# Patient Record
Sex: Female | Born: 1970
Health system: Southern US, Community
[De-identification: ages and names within clinical notes are randomized; demographics above are authoritative.]

## PROBLEM LIST (undated history)

## (undated) DIAGNOSIS — C4491 Basal cell carcinoma of skin, unspecified: Secondary | ICD-10-CM

## (undated) DIAGNOSIS — F988 Other specified behavioral and emotional disorders with onset usually occurring in childhood and adolescence: Secondary | ICD-10-CM

## (undated) DIAGNOSIS — G479 Sleep disorder, unspecified: Secondary | ICD-10-CM

## (undated) DIAGNOSIS — E559 Vitamin D deficiency, unspecified: Secondary | ICD-10-CM

## (undated) DIAGNOSIS — T7840XA Allergy, unspecified, initial encounter: Secondary | ICD-10-CM

## (undated) DIAGNOSIS — E538 Deficiency of other specified B group vitamins: Secondary | ICD-10-CM

## (undated) HISTORY — PX: BREAST ENHANCEMENT SURGERY: SHX7

## (undated) HISTORY — DX: Vitamin D deficiency, unspecified: E55.9

## (undated) HISTORY — DX: Allergy, unspecified, initial encounter: T78.40XA

## (undated) HISTORY — DX: Basal cell carcinoma of skin, unspecified: C44.91

## (undated) HISTORY — DX: Other specified behavioral and emotional disorders with onset usually occurring in childhood and adolescence: F98.8

## (undated) HISTORY — PX: BASAL CELL CARCINOMA EXCISION: SHX1214

## (undated) HISTORY — DX: Deficiency of other specified B group vitamins: E53.8

## (undated) HISTORY — DX: Sleep disorder, unspecified: G47.9

---
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
## 2002-03-12 HISTORY — PX: DILATION AND CURETTAGE OF UTERUS: SHX78

## 2014-05-22 ENCOUNTER — Emergency Department (HOSPITAL_COMMUNITY)
Admission: EM | Admit: 2014-05-22 | Discharge: 2014-05-22 | Discharge status: Against Medical Advice | Payer: Self-pay | Attending: Emergency Medicine | Admitting: Emergency Medicine

## 2014-05-22 ENCOUNTER — Encounter (HOSPITAL_COMMUNITY): Payer: Self-pay

## 2014-05-22 DIAGNOSIS — S60512A Abrasion of left hand, initial encounter: Secondary | ICD-10-CM | POA: Insufficient documentation

## 2014-05-22 DIAGNOSIS — Y92830 Public park as the place of occurrence of the external cause: Secondary | ICD-10-CM | POA: Insufficient documentation

## 2014-05-22 DIAGNOSIS — Y998 Other external cause status: Secondary | ICD-10-CM | POA: Insufficient documentation

## 2014-05-22 DIAGNOSIS — W01198A Fall on same level from slipping, tripping and stumbling with subsequent striking against other object, initial encounter: Secondary | ICD-10-CM | POA: Insufficient documentation

## 2014-05-22 DIAGNOSIS — Y9389 Activity, other specified: Secondary | ICD-10-CM | POA: Insufficient documentation

## 2014-05-22 DIAGNOSIS — S0181XA Laceration without foreign body of other part of head, initial encounter: Secondary | ICD-10-CM | POA: Insufficient documentation

## 2014-05-22 NOTE — ED Notes (Signed)
Pt at park, tripped over cracked sidewalk, fell hit left forehead, LOC <2 min, Bleeding noted to bottom of chin, abrasion on left hand and on outside of left nare.  A&O x 4.

## 2014-11-25 ENCOUNTER — Encounter: Payer: Self-pay | Admitting: Family Medicine

## 2014-11-25 ENCOUNTER — Ambulatory Visit (INDEPENDENT_AMBULATORY_CARE_PROVIDER_SITE_OTHER): Payer: BLUE CROSS/BLUE SHIELD | Admitting: Family Medicine

## 2014-11-25 VITALS — BP 100/64 | HR 71 | Temp 98.1°F | Resp 16 | Ht 67.0 in | Wt 130.0 lb

## 2014-11-25 DIAGNOSIS — F909 Attention-deficit hyperactivity disorder, unspecified type: Secondary | ICD-10-CM

## 2014-11-25 DIAGNOSIS — G479 Sleep disorder, unspecified: Secondary | ICD-10-CM | POA: Insufficient documentation

## 2014-11-25 DIAGNOSIS — E538 Deficiency of other specified B group vitamins: Secondary | ICD-10-CM | POA: Diagnosis not present

## 2014-11-25 DIAGNOSIS — Z803 Family history of malignant neoplasm of breast: Secondary | ICD-10-CM

## 2014-11-25 DIAGNOSIS — Z23 Encounter for immunization: Secondary | ICD-10-CM

## 2014-11-25 DIAGNOSIS — Z0000 Encounter for general adult medical examination without abnormal findings: Secondary | ICD-10-CM | POA: Diagnosis not present

## 2014-11-25 DIAGNOSIS — F988 Other specified behavioral and emotional disorders with onset usually occurring in childhood and adolescence: Secondary | ICD-10-CM | POA: Insufficient documentation

## 2014-11-25 LAB — COMPREHENSIVE METABOLIC PANEL
ALBUMIN: 4.3 g/dL (ref 3.5–5.2)
ALK PHOS: 64 U/L (ref 39–117)
ALT: 8 U/L (ref 0–35)
AST: 14 U/L (ref 0–37)
BILIRUBIN TOTAL: 0.5 mg/dL (ref 0.2–1.2)
BUN: 12 mg/dL (ref 6–23)
CALCIUM: 9.4 mg/dL (ref 8.4–10.5)
CO2: 29 mEq/L (ref 19–32)
CREATININE: 0.63 mg/dL (ref 0.40–1.20)
Chloride: 103 mEq/L (ref 96–112)
GFR: 108.2 mL/min (ref >60.00)
Glucose, Bld: 81 mg/dL (ref 70–99)
Potassium: 4.2 mEq/L (ref 3.5–5.1)
SODIUM: 138 meq/L (ref 135–145)
TOTAL PROTEIN: 7.1 g/dL (ref 6.0–8.3)

## 2014-11-25 LAB — CBC WITH DIFFERENTIAL/PLATELET
Basophils Absolute: 0.1 10*3/uL (ref 0.0–0.1)
Basophils Relative: 0.9 % (ref 0.0–3.0)
Eosinophils Absolute: 0.3 10*3/uL (ref 0.0–0.7)
Eosinophils Relative: 4.7 % (ref 0.0–5.0)
HCT: 42.4 % (ref 36.0–46.0)
Hemoglobin: 14 g/dL (ref 12.0–15.0)
Lymphocytes Relative: 29.1 % (ref 12.0–46.0)
Lymphs Abs: 1.8 10*3/uL (ref 0.7–4.0)
MCHC: 33 g/dL (ref 30.0–36.0)
MCV: 94.9 fl (ref 78.0–100.0)
Monocytes Absolute: 0.6 10*3/uL (ref 0.1–1.0)
Monocytes Relative: 10 % (ref 3.0–12.0)
Neutro Abs: 3.4 10*3/uL (ref 1.4–7.7)
Neutrophils Relative %: 55.3 % (ref 43.0–77.0)
Platelets: 316 10*3/uL (ref 150.0–400.0)
RBC: 4.47 Mil/uL (ref 3.87–5.11)
RDW: 14.2 % (ref 11.5–15.5)
WBC: 6.2 10*3/uL (ref 4.0–10.5)

## 2014-11-25 LAB — TSH: TSH: 1.53 u[IU]/mL (ref 0.35–4.50)

## 2014-11-25 LAB — LIPID PANEL
Cholesterol: 182 mg/dL (ref 0–200)
HDL: 74.4 mg/dL (ref >39.00)
LDL Cholesterol: 89 mg/dL (ref 0–99)
NonHDL: 107.69
Total CHOL/HDL Ratio: 2
Triglycerides: 94 mg/dL (ref 0.0–149.0)
VLDL: 18.8 mg/dL (ref 0.0–40.0)

## 2014-11-25 LAB — VITAMIN B12: VITAMIN B 12: 540 pg/mL (ref 211–911)

## 2014-11-25 MED ORDER — ATOMOXETINE HCL 25 MG PO CAPS: 25.0000 mg | ORAL_CAPSULE | Freq: Two times a day (BID) | ORAL | Status: DC

## 2014-11-25 NOTE — Progress Notes (Signed)
Pre visit review using our clinic review tool, if applicable. No additional management support is needed unless otherwise documented below in the visit note. 

## 2014-11-25 NOTE — Progress Notes (Signed)
Subjective:    Patient ID: Lindsay Burns, female    DOB: 06-04-1970, 44 y.o.   MRN: [id number]  HPI CC: New patient establishment with multiple complaints  All past medical history, allergies, surgical history, family history and social history was obtained from the patient today and entered into the electronic medical record.  Patient moved from [place], [place] , 2 years ago, and has not obtained a PCP in [place]. Patient has a dental home.  ADD: Patient reports history of ADD, that was diagnosed by psychiatry in 2006 and [place]. She states she has noticed symptoms ever since she was in college, however she never sought treatment. She does not like taking medications, and has not been on medications for some time. She states she was on Strattera 40 mg twice a day and Vyvanse at the same time. 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 However she has contemplated over the last year getting back on at least some of the medication, and feels it is time to restart something to help her focus.  Sleep disorder: Patient states she's had a sleep disorder the majority of her life. Her husband does snore, and this causes her to wake up as well. Patient states the majority of her problem is staying asleep, although she does have trouble falling asleep at times. She has never taken a prescribed medication to help with her sleep disorder. She does admit to taking Benadryl about 2 times a week to help her with her sleep. She feels this is moderately effective. She reports no TV use in the bedroom. She does go into the bedroom and read. She does not go to bed at the same time every night, but she does wake up approximately the same time every day.  B-12 deficiency: Patient states that she has had B-12 deficiency in the past and needed B-12  injections. She has not had her B-12 tested with an quite a few years. She endorses bilateral feet discomfort, that is similar to symptoms she had in the past when she had low B-12.  Health maintenance:  Colonoscopy: No family or personal history of colon cancer, colon polyps or irritable bowel disease Mammogram: Family history maternal aunt of breast cancer. Last mammogram 2 years ago, patient states she's had an MRI of her breast for calcifications in the past, but they were normal. Cervical cancer screening: 2014, all normal Pap smears. Immunizations: Flu vaccination and tetanus overdue Infectious disease screening: Completed in 2014, negative   Review of Systems  Constitutional: Negative for fever, chills, activity change, appetite change, fatigue and unexpected weight change.  HENT: Negative for dental problem, hearing loss, tinnitus, trouble swallowing and voice change.   Eyes: Negative for photophobia, pain and visual disturbance.  Respiratory: Negative for chest tightness, shortness of breath and wheezing.   Cardiovascular: Negative for chest pain, palpitations and leg swelling.  Gastrointestinal: Negative for vomiting, abdominal pain, diarrhea, constipation and blood in stool.  Endocrine: Negative for cold intolerance, heat intolerance, polydipsia and polyuria.  Genitourinary: Negative for difficulty urinating, menstrual problem and pelvic pain.  Musculoskeletal: Positive for myalgias. Negative for arthralgias.  Skin: Negative for rash.  Allergic/Immunologic: Positive for environmental allergies. Negative for food allergies.  Neurological: Negative for dizziness, syncope, weakness, light-headedness and headaches.  Hematological: Negative for adenopathy.  Psychiatric/Behavioral: Positive for sleep disturbance  and decreased concentration. Negative for suicidal ideas, hallucinations, behavioral problems and agitation. The patient is not nervous/anxious and is not hyperactive.         Objective:   Physical Exam BP 100/64 mmHg  Pulse 71  Temp(Src) 98.1 F (36.7 C)  Resp 16  Ht 5' 7"  (1.702 m)  Wt 130 lb (58.968 kg)  BMI 20.36 kg/m2  SpO2 99%  LMP 11/24/2014 (Exact Date) Gen: Afebrile. No acute distress. Nontoxic in appearance, well-developed, well-nourished, physically fit Caucasian female. Pleasant, talkative. HENT: AT. [place]. Bilateral TM visualized and normal in appearance.  MMM. Bilateral nares no erythema or swelling. Throat without erythema or exudates.  Eyes:Pupils Equal Round Reactive to light, Extraocular movements intact, Conjunctiva without redness, discharge or icterus. Neck/lymph/endocrine: Supple, no masses, no lymphadenopathy, no thyromegaly CV: RRR no murmur appreciated, no edema, +2/4 P posterior tibialis pulses Chest: CTAB, no wheeze or crackles Abd: Soft. Flat. NTND. BS present. No Masses palpated Skin: No rashes, purpura or petechiae. Skin is intact Neuro/MSK:  Normal gait. PERLA. EOMi. Alert. Oriented x3. Cranial nerves II through XII intact. Muscle strength 5/5 upper and lower extremity. DTRs equal bilaterally. Psych: Normal affect, dress and demeanor. Normal speech.  Normal judgment. Mild tangential thought process     Assessment & Plan:  1. ADD (attention deficit disorder) - restart Strattera today, tapering up to 50 mg daily. Patient will follow-up in 4 weeks, and we'll discuss that time if we need to continue to taper to a higher dose. - Patient had been taken Strattera twice a day instead of daily in the past, advised patient if she is to do this I wouldn't take it in the evening hours despite not being a stimulant, as it may cause her sleep disorder to worsen. - atomoxetine (STRATTERA) 25 MG capsule; Take 1 capsule (25 mg total) by mouth 2 (two) times daily. 25 mg QD for 3 days, then 25 mg BID  Dispense: 60 capsule; Refill: 0 - Follow-up in 4 weeks  2. Health care maintenance Colonoscopy: No family or personal history of colon cancer,  colon polyps or irritable bowel disease routine screening start at age 50 Mammogram: Family history maternal aunt of breast cancer. Last mammogram 2 years ago, patient states she's had an MRI of her breast for calcifications in the past, but they were normal. Mammogram ordered today, patient does have implants. Cervical cancer screening: 2014, all normal Pap smears. Patient to schedule Pap smear within the next 6 months. Immunizations: Flu vaccination and tetanus overdue. Patient declined flu vaccination today, tdap given. Infectious disease screening: Completed in 2014, negative - Tdap vaccine greater than or equal to 7yo IM - CBC w/Diff - Comp Met (CMET) - Lipid panel - TSH  3. B12 deficiency - Currently taking supplements, with lower extremity symptoms. Will retest B-12 level today, and prescribe injections if needed. - B12  4. Family history of breast cancer - All patients prior mammograms have been normal, with the exception of some calcifications that needed further imaging by MRI and was considered benign. No records are available. - MM DIGITAL SCREENING W/ IMPLANTS BILATERAL; Future   5. Sleep disorder - Discussed her sleep disorder today. Discussed good sleep hygiene. Patient is currently okay with taking Benadryl as needed.  - Continue to follow  Education: AVS on health maintenance recommendations for age/sex provided  Four-week follow-up for ADD, preventive visit within the next 6 months with Pap

## 2014-11-25 NOTE — Patient Instructions (Signed)
Health Maintenance Adopting a healthy lifestyle and getting preventive care can go a long way to promote health and wellness. Talk with your health care provider about what schedule of regular examinations is right for you. This is a good chance for you to check in with your provider about disease prevention and staying healthy. In between checkups, there are plenty of things you can do on your own. Experts have done a lot of research about which lifestyle changes and preventive measures are most likely to keep you healthy. Ask your health care provider for more information. WEIGHT AND DIET  Eat a healthy diet  Be sure to include plenty of vegetables, fruits, low-fat dairy products, and lean protein.  Do not eat a lot of foods high in solid fats, added sugars, or salt.  Get regular exercise. This is one of the most important things you can do for your health.  Most adults should exercise for at least 150 minutes each week. The exercise should increase your heart rate and make you sweat (moderate-intensity exercise).  Most adults should also do strengthening exercises at least twice a week. This is in addition to the moderate-intensity exercise.  Maintain a healthy weight  Body mass index (BMI) is a measurement that can be used to identify possible weight problems. It estimates body fat based on height and weight. Your health care provider can help determine your BMI and help you achieve or maintain a healthy weight.  For females 47 years of age and older:   A BMI below 18.5 is considered underweight.  A BMI of 18.5 to 24.9 is normal.  A BMI of 25 to 29.9 is considered overweight.  A BMI of 30 and above is considered obese.  Watch levels of cholesterol and blood lipids  You should start having your blood tested for lipids and cholesterol at 46 years of age, then have this test every 5 years.  You may need to have your cholesterol levels checked more often if:  Your lipid or  cholesterol levels are high.  You are older than 46 years of age.  You are at high risk for heart disease.  CANCER SCREENING   Lung Cancer  Lung cancer screening is recommended for adults 32-73 years old who are at high risk for lung cancer because of a history of smoking.  A yearly low-dose CT scan of the lungs is recommended for people who:  Currently smoke.  Have quit within the past 15 years.  Have at least a 30-pack-year history of smoking. A pack year is smoking an average of one pack of cigarettes a day for 1 year.  Yearly screening should continue until it has been 15 years since you quit.  Yearly screening should stop if you develop a health problem that would prevent you from having lung cancer treatment.  Breast Cancer  Practice breast self-awareness. This means understanding how your breasts normally appear and feel.  It also means doing regular breast self-exams. Let your health care provider know about any changes, no matter how small.  If you are in your 20s or 30s, you should have a clinical breast exam (CBE) by a health care provider every 1-3 years as part of a regular health exam.  If you are 48 or older, have a CBE every year. Also consider having a breast X-ray (mammogram) every year.  If you have a family history of breast cancer, talk to your health care provider about genetic screening.  If you are  at high risk for breast cancer, talk to your health care provider about having an MRI and a mammogram every year.  Breast cancer gene (BRCA) assessment is recommended for women who have family members with BRCA-related cancers. BRCA-related cancers include:  Breast.  Ovarian.  Tubal.  Peritoneal cancers.  Results of the assessment will determine the need for genetic counseling and BRCA1 and BRCA2 testing. Cervical Cancer Routine pelvic examinations to screen for cervical cancer are no longer recommended for nonpregnant women who are considered low  risk for cancer of the pelvic organs (ovaries, uterus, and vagina) and who do not have symptoms. A pelvic examination may be necessary if you have symptoms including those associated with pelvic infections. Ask your health care provider if a screening pelvic exam is right for you.   The Pap test is the screening test for cervical cancer for women who are considered at risk.  If you had a hysterectomy for a problem that was not cancer or a condition that could lead to cancer, then you no longer need Pap tests.  If you are older than 65 years, and you have had normal Pap tests for the past 10 years, you no longer need to have Pap tests.  If you have had past treatment for cervical cancer or a condition that could lead to cancer, you need Pap tests and screening for cancer for at least 20 years after your treatment.  If you no longer get a Pap test, assess your risk factors if they change (such as having a new sexual partner). This can affect whether you should start being screened again.  Some women have medical problems that increase their chance of getting cervical cancer. If this is the case for you, your health care provider may recommend more frequent screening and Pap tests.  The human papillomavirus (HPV) test is another test that may be used for cervical cancer screening. The HPV test looks for the virus that can cause cell changes in the cervix. The cells collected during the Pap test can be tested for HPV.  The HPV test can be used to screen women 30 years of age and older. Getting tested for HPV can extend the interval between normal Pap tests from three to five years.  An HPV test also should be used to screen women of any age who have unclear Pap test results.  After 46 years of age, women should have HPV testing as often as Pap tests.  Colorectal Cancer  This type of cancer can be detected and often prevented.  Routine colorectal cancer screening usually begins at 46 years of  age and continues through 46 years of age.  Your health care provider may recommend screening at an earlier age if you have risk factors for colon cancer.  Your health care provider may also recommend using home test kits to check for hidden blood in the stool.  A small camera at the end of a tube can be used to examine your colon directly (sigmoidoscopy or colonoscopy). This is done to check for the earliest forms of colorectal cancer.  Routine screening usually begins at age 50.  Direct examination of the colon should be repeated every 5-10 years through 46 years of age. However, you may need to be screened more often if early forms of precancerous polyps or small growths are found. Skin Cancer  Check your skin from head to toe regularly.  Tell your health care provider about any new moles or changes in   moles, especially if there is a change in a mole's shape or color.  Also tell your health care provider if you have a mole that is larger than the size of a pencil eraser.  Always use sunscreen. Apply sunscreen liberally and repeatedly throughout the day.  Protect yourself by wearing long sleeves, pants, a wide-brimmed hat, and sunglasses whenever you are outside. HEART DISEASE, DIABETES, AND HIGH BLOOD PRESSURE   Have your blood pressure checked at least every 1-2 years. High blood pressure causes heart disease and increases the risk of stroke.  If you are between 75 years and 42 years old, ask your health care provider if you should take aspirin to prevent strokes.  Have regular diabetes screenings. This involves taking a blood sample to check your fasting blood sugar level.  If you are at a normal weight and have a low risk for diabetes, have this test once every three years after 46 years of age.  If you are overweight and have a high risk for diabetes, consider being tested at a younger age or more often. PREVENTING INFECTION  Hepatitis B  If you have a higher risk for  hepatitis B, you should be screened for this virus. You are considered at high risk for hepatitis B if:  You were born in a country where hepatitis B is common. Ask your health care provider which countries are considered high risk.  Your parents were born in a high-risk country, and you have not been immunized against hepatitis B (hepatitis B vaccine).  You have HIV or AIDS.  You use needles to inject street drugs.  You live with someone who has hepatitis B.  You have had sex with someone who has hepatitis B.  You get hemodialysis treatment.  You take certain medicines for conditions, including cancer, organ transplantation, and autoimmune conditions. Hepatitis C  Blood testing is recommended for:  Everyone born from [birth year] through 1967.  Anyone with known risk factors for hepatitis C. Sexually transmitted infections (STIs)  You should be screened for sexually transmitted infections (STIs) including gonorrhea and chlamydia if:  You are sexually active and are younger than 46 years of age.  You are older than 46 years of age and your health care provider tells you that you are at risk for this type of infection.  Your sexual activity has changed since you were last screened and you are at an increased risk for chlamydia or gonorrhea. Ask your health care provider if you are at risk.  If you do not have HIV, but are at risk, it may be recommended that you take a prescription medicine daily to prevent HIV infection. This is called pre-exposure prophylaxis (PrEP). You are considered at risk if:  You are sexually active and do not regularly use condoms or know the HIV status of your partner(s).  You take drugs by injection.  You are sexually active with a partner who has HIV. Talk with your health care provider about whether you are at high risk of being infected with HIV. If you choose to begin PrEP, you should first be tested for HIV. You should then be tested every 3 months for  as long as you are taking PrEP.  PREGNANCY   If you are premenopausal and you may become pregnant, ask your health care provider about preconception counseling.  If you may become pregnant, take 400 to 800 micrograms (mcg) of folic acid every day.  If you want to prevent pregnancy, talk to your  health care provider about birth control (contraception). OSTEOPOROSIS AND MENOPAUSE   Osteoporosis is a disease in which the bones lose minerals and strength with aging. This can result in serious bone fractures. Your risk for osteoporosis can be identified using a bone density scan.  If you are 19 years of age or older, or if you are at risk for osteoporosis and fractures, ask your health care provider if you should be screened.  Ask your health care provider whether you should take a calcium or vitamin D supplement to lower your risk for osteoporosis.  Menopause may have certain physical symptoms and risks.  Hormone replacement therapy may reduce some of these symptoms and risks. Talk to your health care provider about whether hormone replacement therapy is right for you.  HOME CARE INSTRUCTIONS   Schedule regular health, dental, and eye exams.  Stay current with your immunizations.   Do not use any tobacco products including cigarettes, chewing tobacco, or electronic cigarettes.  If you are pregnant, do not drink alcohol.  If you are breastfeeding, limit how much and how often you drink alcohol.  Limit alcohol intake to no more than 1 drink per day for nonpregnant women. One drink equals 12 ounces of beer, 5 ounces of wine, or 1 ounces of hard liquor.  Do not use street drugs.  Do not share needles.  Ask your health care provider for help if you need support or information about quitting drugs.  Tell your health care provider if you often feel depressed.  Tell your health care provider if you have ever been abused or do not feel safe at home. Document Released: 09/11/2010  Document Revised: 07/13/2013 Document Reviewed: 01/28/2013 [facility] Patient Information 2015 [place], [place]. This information is not intended to replace advice given to you by your health care provider. Make sure you discuss any questions you have with your health care provider.   Insomnia Insomnia is frequent trouble falling and/or staying asleep. Insomnia can be a long term problem or a short term problem. Both are common. Insomnia can be a short term problem when the wakefulness is related to a certain stress or worry. Long term insomnia is often related to ongoing stress during waking hours and/or poor sleeping habits. Overtime, sleep deprivation itself can make the problem worse. Every little thing feels more severe because you are overtired and your ability to cope is decreased. CAUSES   Stress, anxiety, and depression.  Poor sleeping habits.  Distractions such as TV in the bedroom.  Naps close to bedtime.  Engaging in emotionally charged conversations before bed.  Technical reading before sleep.  Alcohol and other sedatives. They may make the problem worse. They can hurt normal sleep patterns and normal dream activity.  Stimulants such as caffeine for several hours prior to bedtime.  Pain syndromes and shortness of breath can cause insomnia.  Exercise late at night.  Changing time zones may cause sleeping problems (jet lag). It is sometimes helpful to have someone observe your sleeping patterns. They should look for periods of not breathing during the night (sleep apnea). They should also look to see how long those periods last. If you live alone or observers are uncertain, you can also be observed at a sleep clinic where your sleep patterns will be professionally monitored. Sleep apnea requires a checkup and treatment. Give your caregivers your medical history. Give your caregivers observations your family has made about your sleep.  SYMPTOMS   Not feeling rested in the  morning.  Anxiety and restlessness at bedtime.  Difficulty falling and staying asleep. TREATMENT   Your caregiver may prescribe treatment for an underlying medical disorders. Your caregiver can give advice or help if you are using alcohol or other drugs for self-medication. Treatment of underlying problems will usually eliminate insomnia problems.  Medications can be prescribed for short time use. They are generally not recommended for lengthy use.  Over-the-counter sleep medicines are not recommended for lengthy use. They can be habit forming.  You can promote easier sleeping by making lifestyle changes such as:  Using relaxation techniques that help with breathing and reduce muscle tension.  Exercising earlier in the day.  Changing your diet and the time of your last meal. No night time snacks.  Establish a regular time to go to bed.  Counseling can help with stressful problems and worry.  Soothing music and white noise may be helpful if there are background noises you cannot remove.  Stop tedious detailed work at least one hour before bedtime. HOME CARE INSTRUCTIONS   Keep a diary. Inform your caregiver about your progress. This includes any medication side effects. See your caregiver regularly. Take note of:  Times when you are asleep.  Times when you are awake during the night.  The quality of your sleep.  How you feel the next day. This information will help your caregiver care for you.  Get out of bed if you are still awake after 15 minutes. Read or do some quiet activity. Keep the lights down. Wait until you feel sleepy and go back to bed.  Keep regular sleeping and waking hours. Avoid naps.  Exercise regularly.  Avoid distractions at bedtime. Distractions include watching television or engaging in any intense or detailed activity like attempting to balance the household checkbook.  Develop a bedtime ritual. Keep a familiar routine of bathing, brushing your  teeth, climbing into bed at the same time each night, listening to soothing music. Routines increase the success of falling to sleep faster.  Use relaxation techniques. This can be using breathing and muscle tension release routines. It can also include visualizing peaceful scenes. You can also help control troubling or intruding thoughts by keeping your mind occupied with boring or repetitive thoughts like the old concept of counting sheep. You can make it more creative like imagining planting one beautiful flower after another in your backyard garden.  During your day, work to eliminate stress. When this is not possible use some of the previous suggestions to help reduce the anxiety that accompanies stressful situations. MAKE SURE YOU:   Understand these instructions.  Will watch your condition.  Will get help right away if you are not doing well or get worse. Document Released: 02/24/2000 Document Revised: 05/21/2011 Document Reviewed: 03/26/2007 [facility] Patient Information 2015 [place], [place]. This information is not intended to replace advice given to you by your health care provider. Make sure you discuss any questions you have with your health care provider.   4 weeks for ADD follow up

## 2014-11-26 ENCOUNTER — Telehealth: Payer: Self-pay | Admitting: Family Medicine

## 2014-11-26 NOTE — Telephone Encounter (Signed)
Patient aware of results. No questions at this time.

## 2014-11-26 NOTE — Telephone Encounter (Signed)
Please call pt, all of her lab work was normal, including her B12 level.

## 2014-12-23 ENCOUNTER — Ambulatory Visit (INDEPENDENT_AMBULATORY_CARE_PROVIDER_SITE_OTHER): Payer: BLUE CROSS/BLUE SHIELD | Admitting: Family Medicine

## 2014-12-23 ENCOUNTER — Other Ambulatory Visit: Payer: Self-pay | Admitting: Family Medicine

## 2014-12-23 ENCOUNTER — Encounter: Payer: Self-pay | Admitting: Family Medicine

## 2014-12-23 VITALS — BP 96/66 | HR 71 | Temp 98.2°F | Resp 18 | Ht 67.0 in | Wt 130.0 lb

## 2014-12-23 DIAGNOSIS — F988 Other specified behavioral and emotional disorders with onset usually occurring in childhood and adolescence: Secondary | ICD-10-CM

## 2014-12-23 DIAGNOSIS — F909 Attention-deficit hyperactivity disorder, unspecified type: Secondary | ICD-10-CM | POA: Diagnosis not present

## 2014-12-23 DIAGNOSIS — Z803 Family history of malignant neoplasm of breast: Secondary | ICD-10-CM

## 2014-12-23 DIAGNOSIS — Z1231 Encounter for screening mammogram for malignant neoplasm of breast: Secondary | ICD-10-CM

## 2014-12-23 MED ORDER — ATOMOXETINE HCL 25 MG PO CAPS: 25.0000 mg | ORAL_CAPSULE | Freq: Two times a day (BID) | ORAL | Status: DC

## 2014-12-23 NOTE — Progress Notes (Signed)
Pre visit review using our clinic review tool, if applicable. No additional management support is needed unless otherwise documented below in the visit note. 

## 2014-12-23 NOTE — Progress Notes (Signed)
Subjective:    Patient ID: Lindsay Burns, female    DOB: 04-11-1970, 44 y.o.   MRN: [id number]  HPI  ADD: Patient presents to follow-up on her attention deficit disorder. Patient states she has started the Strattera 25 mg. However she does admit to not taking it 2 times a day as we discussed. She states she routinely forgets to take it in the afternoon. She and her husband are setting up the system so that she can more easily remember to take her medication as directed. She states she does feel a small positive effect after taking the medication. She denies any negative side effects. She reports she is eating and sleeping a little better.  Prior note:  ADD: Patient reports history of ADD, that was diagnosed by psychiatry in 2006 and [place]. She states she has noticed symptoms ever since she was in college, however she never sought treatment. She does not like taking medications, and has not been on medications for some time. She states she was on Strattera 40 mg twice a day and Vyvanse at the same time. 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 However she has contemplated over the last year getting back on at least some of the medication, and feels it is time to restart something to help her focus.  Past Medical History  Diagnosis Date  . ADD (attention deficit disorder)     dx: 2006  . Basal cell carcinoma     left side of face  . Sleep disorder   . B12 deficiency    No Known Allergies Social History   Social History  . Marital Status: Married    Spouse Name: N/A  . Number of Children: 2  . Years of Education: J.D.   Occupational History  . 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    Social History Main Topics  . Smoking status: Never Smoker   . Smokeless tobacco: Not on file  . Alcohol Use:  0.6 oz/week    1 Standard drinks or equivalent per week     Comment: most fridays will have 1 drink   . Drug Use: No  . Sexual Activity:    Partners: Male    Birth Control/ Protection: None   Other Topics Concern  . Not on file   Social History Narrative   G3P2. Patient is married. Her husband works at [employer]. 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 She lives with her husband, son and daughter.   - She feels safe in her relationship, she wears her seatbelt, she wears a bike helmet, there are smoke alarms in her home. There are no firearms in her home.   - She drinks alcohol occasionally, she drinks caffeinated beverages, she does not take vitamins or herbal remedies.   - She exercises regularly.    Review of Systems Negative, with the exception of above mentioned in HPI     Objective:   Physical Exam BP 96/66 mmHg  Pulse 71  Temp(Src) 98.2 F (36.8 C) (Temporal)  Resp 18  Ht 5\' 7"  (1.702 m)  Wt 130 lb (58.968 kg)  BMI 20.36 kg/m2  SpO2 98%  LMP 12/22/2014 Gen: Afebrile. No acute distress. Nontoxic in appearance, well-developed, well-nourished, very pleasant  Caucasian female. HENT: AT. [place].MMM. Eyes:Pupils Equal Round Reactive to light, Extraocular movements intact,  Conjunctiva without redness, discharge or icterus. CV: RRR  Chest: CTAB, no wheeze or crackles Neuro: Normal gait. PERLA. EOMi. Alert. Oriented x3  Psych: Normal affect, dress and demeanor. Normal speech. Normal thought content and judgment..    Assessment & Plan:  1. ADD (attention deficit disorder) - Refills of Strattera given today for 3 months. Patient encouraged to set up the system, set an alarm on her phone place pill bottle where she can see it that she can remember to take the medication twice a day. Patient is to report any negative symptoms of experienced after routinely taking medication as directed.  follow-up in 3 months. - atomoxetine (STRATTERA) 25 MG  capsule; Take 1 capsule (25 mg total) by mouth 2 (two) times daily. 25 mg QD for 3 days, then 25 mg BID  Dispense: 60 capsule; Refill: 0

## 2014-12-23 NOTE — Patient Instructions (Signed)
Insomnia Insomnia is a sleep disorder that makes it difficult to fall asleep or to stay asleep. Insomnia can cause tiredness (fatigue), low energy, difficulty concentrating, mood swings, and poor performance at work or school.  There are three different ways to classify insomnia:  Difficulty falling asleep.  Difficulty staying asleep.  Waking up too early in the morning. Any type of insomnia can be long-term (chronic) or short-term (acute). Both are common. Short-term insomnia usually lasts for three months or less. Chronic insomnia occurs at least three times a week for longer than three months. CAUSES  Insomnia may be caused by another condition, situation, or substance, such as:  Anxiety.  Certain medicines.  Gastroesophageal reflux disease (GERD) or other gastrointestinal conditions.  Asthma or other breathing conditions.  Restless legs syndrome, sleep apnea, or other sleep disorders.  Chronic pain.  Menopause. This may include hot flashes.  Stroke.  Abuse of alcohol, tobacco, or illegal drugs.  Depression.  Caffeine.   Neurological disorders, such as Alzheimer disease.  An overactive thyroid (hyperthyroidism). The cause of insomnia may not be known. RISK FACTORS Risk factors for insomnia include:  Gender. Women are more commonly affected than men.  Age. Insomnia is more common as you get older.  Stress. This may involve your professional or personal life.  Income. Insomnia is more common in people with lower income.  Lack of exercise.   Irregular work schedule or night shifts.  Traveling between different time zones. SIGNS AND SYMPTOMS If you have insomnia, trouble falling asleep or trouble staying asleep is the main symptom. This may lead to other symptoms, such as:  Feeling fatigued.  Feeling nervous about going to sleep.  Not feeling rested in the morning.  Having trouble concentrating.  Feeling irritable, anxious, or depressed. TREATMENT   Treatment for insomnia depends on the cause. If your insomnia is caused by an underlying condition, treatment will focus on addressing the condition. Treatment may also include:   Medicines to help you sleep.  Counseling or therapy.  Lifestyle adjustments. HOME CARE INSTRUCTIONS   Take medicines only as directed by your health care provider.  Keep regular sleeping and waking hours. Avoid naps.  Keep a sleep diary to help you and your health care provider figure out what could be causing your insomnia. Include:   When you sleep.  When you wake up during the night.  How well you sleep.   How rested you feel the next day.  Any side effects of medicines you are taking.  What you eat and drink.   Make your bedroom a comfortable place where it is easy to fall asleep:  Put up shades or special blackout curtains to block light from outside.  Use a white noise machine to block noise.  Keep the temperature cool.   Exercise regularly as directed by your health care provider. Avoid exercising right before bedtime.  Use relaxation techniques to manage stress. Ask your health care provider to suggest some techniques that may work well for you. These may include:  Breathing exercises.  Routines to release muscle tension.  Visualizing peaceful scenes.  Cut back on alcohol, caffeinated beverages, and cigarettes, especially close to bedtime. These can disrupt your sleep.  Do not overeat or eat spicy foods right before bedtime. This can lead to digestive discomfort that can make it hard for you to sleep.  Limit screen use before bedtime. This includes:  Watching TV.  Using your smartphone, tablet, and computer.  Stick to a routine. This   can help you fall asleep faster. Try to do a quiet activity, brush your teeth, and go to bed at the same time each night.  Get out of bed if you are still awake after 15 minutes of trying to sleep. Keep the lights down, but try reading or  doing a quiet activity. When you feel sleepy, go back to bed.  Make sure that you drive carefully. Avoid driving if you feel very sleepy.  Keep all follow-up appointments as directed by your health care provider. This is important. SEEK MEDICAL CARE IF:   You are tired throughout the day or have trouble in your daily routine due to sleepiness.  You continue to have sleep problems or your sleep problems get worse. SEEK IMMEDIATE MEDICAL CARE IF:   You have serious thoughts about hurting yourself or someone else.   This information is not intended to replace advice given to you by your health care provider. Make sure you discuss any questions you have with your health care provider.   Document Released: 02/24/2000 Document Revised: 11/17/2014 Document Reviewed: 11/27/2013 Elsevier Interactive Patient Education 2016 [place].  F/U 3 months Preventive/CPE with PAP March.

## 2015-01-13 ENCOUNTER — Ambulatory Visit
Admission: RE | Admit: 2015-01-13 | Discharge: 2015-01-13 | Disposition: A | Discharge status: Home / Self Care | Payer: BLUE CROSS/BLUE SHIELD | Source: Ambulatory Visit | Attending: Family Medicine | Admitting: Family Medicine

## 2015-01-13 DIAGNOSIS — Z803 Family history of malignant neoplasm of breast: Secondary | ICD-10-CM

## 2015-01-13 DIAGNOSIS — R928 Other abnormal and inconclusive findings on diagnostic imaging of breast: Secondary | ICD-10-CM

## 2015-01-13 DIAGNOSIS — Z1231 Encounter for screening mammogram for malignant neoplasm of breast: Secondary | ICD-10-CM

## 2015-02-02 DIAGNOSIS — R928 Other abnormal and inconclusive findings on diagnostic imaging of breast: Principal | ICD-10-CM

## 2015-02-02 DIAGNOSIS — Z1239 Encounter for other screening for malignant neoplasm of breast: Secondary | ICD-10-CM | POA: Insufficient documentation

## 2015-02-08 ENCOUNTER — Other Ambulatory Visit: Payer: Self-pay | Admitting: Family Medicine

## 2015-02-08 DIAGNOSIS — R928 Other abnormal and inconclusive findings on diagnostic imaging of breast: Secondary | ICD-10-CM

## 2015-02-09 ENCOUNTER — Ambulatory Visit
Admission: RE | Admit: 2015-02-09 | Discharge: 2015-02-09 | Disposition: A | Discharge status: Home / Self Care | Payer: BLUE CROSS/BLUE SHIELD | Source: Ambulatory Visit | Attending: Family Medicine | Admitting: Family Medicine

## 2015-02-09 DIAGNOSIS — R928 Other abnormal and inconclusive findings on diagnostic imaging of breast: Secondary | ICD-10-CM

## 2015-04-19 ENCOUNTER — Other Ambulatory Visit: Payer: Self-pay | Admitting: Family Medicine

## 2015-04-19 ENCOUNTER — Telehealth: Payer: Self-pay | Admitting: *Deleted

## 2015-04-19 DIAGNOSIS — F988 Other specified behavioral and emotional disorders with onset usually occurring in childhood and adolescence: Secondary | ICD-10-CM

## 2015-04-19 MED ORDER — ATOMOXETINE HCL 25 MG PO CAPS: 25.0000 mg | ORAL_CAPSULE | Freq: Two times a day (BID) | ORAL | Status: DC

## 2015-04-19 NOTE — Telephone Encounter (Signed)
Received request for refill on strattera.last filled in 02/19/15 patient last seen on 12/23/14.

## 2015-04-19 NOTE — Telephone Encounter (Signed)
Refill x1 on Strattera, pt will need appt for refills. She is due for her CPE in March, these can be covered at the same time as long as her medication regimen for ADD is stable.

## 2015-04-19 NOTE — Telephone Encounter (Signed)
Left message for patient Rx ready for pick up . Patient needs to schedule CPE for March for further refills.

## 2015-08-09 ENCOUNTER — Other Ambulatory Visit (HOSPITAL_COMMUNITY)
Admission: RE | Admit: 2015-08-09 | Discharge: 2015-08-09 | Disposition: A | Discharge status: Home / Self Care | Payer: BLUE CROSS/BLUE SHIELD | Source: Ambulatory Visit | Attending: Family Medicine | Admitting: Family Medicine

## 2015-08-09 ENCOUNTER — Ambulatory Visit (INDEPENDENT_AMBULATORY_CARE_PROVIDER_SITE_OTHER): Payer: BLUE CROSS/BLUE SHIELD | Admitting: Family Medicine

## 2015-08-09 ENCOUNTER — Encounter: Payer: Self-pay | Admitting: Family Medicine

## 2015-08-09 VITALS — BP 97/63 | HR 73 | Temp 97.8°F | Resp 20 | Ht 67.0 in | Wt 132.0 lb

## 2015-08-09 DIAGNOSIS — Z01419 Encounter for gynecological examination (general) (routine) without abnormal findings: Secondary | ICD-10-CM | POA: Insufficient documentation

## 2015-08-09 DIAGNOSIS — Z124 Encounter for screening for malignant neoplasm of cervix: Secondary | ICD-10-CM

## 2015-08-09 DIAGNOSIS — Z79899 Other long term (current) drug therapy: Secondary | ICD-10-CM

## 2015-08-09 DIAGNOSIS — Z13220 Encounter for screening for lipoid disorders: Secondary | ICD-10-CM | POA: Diagnosis not present

## 2015-08-09 DIAGNOSIS — Z1151 Encounter for screening for human papillomavirus (HPV): Secondary | ICD-10-CM | POA: Insufficient documentation

## 2015-08-09 DIAGNOSIS — F909 Attention-deficit hyperactivity disorder, unspecified type: Secondary | ICD-10-CM

## 2015-08-09 DIAGNOSIS — F988 Other specified behavioral and emotional disorders with onset usually occurring in childhood and adolescence: Secondary | ICD-10-CM

## 2015-08-09 DIAGNOSIS — Z130 Encounter for screening for diseases of the blood and blood-forming organs and certain disorders involving the immune mechanism: Secondary | ICD-10-CM

## 2015-08-09 DIAGNOSIS — Z0000 Encounter for general adult medical examination without abnormal findings: Secondary | ICD-10-CM | POA: Insufficient documentation

## 2015-08-09 DIAGNOSIS — Z1329 Encounter for screening for other suspected endocrine disorder: Secondary | ICD-10-CM

## 2015-08-09 MED ORDER — ATOMOXETINE HCL 25 MG PO CAPS: 25.0000 mg | ORAL_CAPSULE | Freq: Two times a day (BID) | ORAL | Status: DC

## 2015-08-09 NOTE — Progress Notes (Signed)
Patient ID: Lindsay Burns, female   DOB: 05-01-1970, 45 y.o.   MRN: [id number]      Patient ID: Lindsay Burns, female  DOB: 1971/01/04, 45 y.o.   MRN: [id number]  Subjective:  Lindsay Burns is a 45 y.o. female present for CPE with PAP All past medical history, surgical history, allergies, family history, immunizations, medications and social history were updated in the electronic medical record today. All recent labs, ED visits and hospitalizations within the last year were reviewed.  ADD:  Patient states she does better with the Strattera 25 mg BID. However she does admit to not taking it 2 times a day as we discussed routinely because she forgets. She states she does feel a positive effect after taking the medication. She denies any negative side effects. She is in need of refills today.  Health maintenance:  Colonoscopy: No family or personal history of colon cancer, colon polyps or irritable bowel disease. Screen at 50.  Mammogram: 01/2015 mammogram w/implants, needed further studies of right breast for concerns of mass, however further studies proved negative. Birads 1 , yearly screen. Family history maternal aunt of breast cancer. Cervical cancer screening: 2014, all normal Pap smears. Immunizations: Flu vaccination and tetanus UTD 2016 Infectious disease screening: Completed in 2014, negative DEXA: Not indicated Assistive device: None  Oxygen [id number]  Patient has a Dental home. Hospitalizations/ED visits: None   Past Medical History  Diagnosis Date  . ADD (attention deficit disorder)     dx: 2006  . Basal cell carcinoma     left side of face  . Sleep disorder   . B12 deficiency    No Known Allergies Past Surgical History  Procedure Laterality Date  . Temporomandibular joint surgery Bilateral 1987  . Dilation and curettage of uterus  2004  . Breast enhancement surgery Bilateral     1999  . Basal cell carcinoma excision Left     left face   Family  History  Problem Relation Age of Onset  . Alcohol abuse Mother     was in [place]  . Breast cancer Mother 68  . Depression Mother   . Alcohol abuse Father     was in [place]  . Lung cancer Father   . Brain cancer Father   . Breast cancer Paternal Grandmother    Social History   Social History  . Marital Status: Married    Spouse Name: N/A  . Number of Children: 2  . Years of Education: J.D.   Occupational History  . 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    Social History Main Topics  . Smoking status: Never Smoker   . Smokeless tobacco: Not on file  . Alcohol Use: 0.6 oz/week    1 Standard drinks or equivalent per week     Comment: most fridays will have 1 drink   . Drug Use: No  . Sexual Activity:    Partners: Male    Birth Control/ Protection: None   Other Topics Concern  . Not on file   Social History Narrative   G3P2. Patient is married. Her husband works at [employer]. 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 She lives with her husband, son and daughter.   - She feels safe in her relationship, she wears her seatbelt, she wears a bike helmet, there are smoke alarms in her home. There are no firearms in her home.   -  She drinks alcohol occasionally, she drinks caffeinated beverages, she does not take vitamins or herbal remedies.   - She exercises regularly.     ROS: Negative, with the exception of above mentioned in HPI  Objective: BP 97/63 mmHg  Pulse 73  Temp(Src) 97.8 F (36.6 C) (Oral)  Resp 20  Ht 5\' 7"  (1.702 m)  Wt 132 lb (59.875 kg)  BMI 20.67 kg/m2  SpO2 98%  LMP 07/26/2015 (Approximate) Gen: Afebrile. No acute distress. Nontoxic in appearance, well-developed, well-nourished, caucasian female, very pleasant.  HENT: AT. [place]. Bilateral TM visualized and normal in appearance, normal external auditory canal. MMM, no oral lesions, Good dentition. Bilateral nares without erythema or swelling. Throat  without erythema, ulcerations or exudates. No  Cough on exam, no hoarseness on exam. Eyes:Pupils Equal Round Reactive to light, Extraocular movements intact,  Conjunctiva without redness, discharge or icterus. Neck/lymp/endocrine: Supple,No  lymphadenopathy, No thyromegaly CV: RRR, nomurmur, No edema, +2/4 P posterior tibialis pulses.  Chest: CTAB, no wheeze, rhonchi or crackles. Normal Respiratory effort. Good Air movement. Abd: Soft. flat. NTND. BS present. No Masses palpated. No hepatosplenomegaly. No rebound tenderness or guarding. Skin: No rashes, purpura or petechiae. Warm and well-perfused. Skin intact. Neuro/Msk: Normal gait. PERLA. EOMi. Alert. Oriented x3.  Cranial nerves II through XII intact. Muscle strength 5/5 UE/LE extremity. DTRs equal bilaterally. Psych: Normal affect, dress and demeanor. Normal speech. Normal thought content and judgment. Breasts:Implants present. breasts appear normal, symmetrical, no tenderness on exam, no suspicious masses, no skin or nipple changes or axillary nodes. GYN:  External genitalia within normal limits, normal hair distribution, no lesions. Urethral meatus normal, no lesions. Vaginal mucosa pink, moist, normal rugae, no lesions. No cystocele or rectocele. cervix without lesions, moderate thin white discharge,  Bimanual exam revealed normal uterus.  No bladder/suprapubic fullness, masses or tenderness. No cervical motion tenderness. No adnexal fullness. Anus and perineum within normal limits, no lesions.  Assessment/plan: Lindsay Burns is a 45 y.o. female present for annual exam. 1. ADD (attention deficit disorder) - atomoxetine (STRATTERA) 25 MG capsule; Take 1 capsule (25 mg total) by mouth 2 (two) times daily.  Dispense: 180 capsule; Refill: 1 - Comprehensive metabolic panel; Future   2. Pap smear for cervical cancer screening - Cytology - PAP   3. Screening cholesterol level - Lipid panel; Future  4. Screening, anemia, deficiency,  iron - CBC w/Diff; Future  5. Thyroid disorder screen - TSH; Future  6. Encounter for long-term (current) use of medications - Comprehensive metabolic panel; Future  7. Encounter for preventive health examination - Cbc, cmp, lipid panel, tsh - PAP today with HPV co-test Colonoscopy: No family or personal history of colon cancer, colon polyps or irritable bowel disease. Screen at 50.  Mammogram: 01/2015 mammogram w/implants, needed further studies of right breast for concerns of mass, however further studies proved negative. Birads 1 , yearly screen. Family history maternal aunt of breast cancer. Cervical cancer screening: 2014, all normal Pap smears. Immunizations: Flu vaccination and tetanus UTD 2016 Infectious disease screening: Completed in 2014, negative DEXA: Not indicated -Patient was encouraged to exercise greater than 150 minutes a week. Patient was encouraged to choose a diet filled with fresh fruits and vegetables, and lean meats. AVS provided to patient today for education/recommendation on gender specific health and safety maintenance.  Return in about 6 months (around 02/09/2016) for add.  Electronically signed by: Howard Pouch, DO [place]

## 2015-08-09 NOTE — Patient Instructions (Signed)
Health Maintenance, Female Adopting a healthy lifestyle and getting preventive care can go a long way to promote health and wellness. Talk with your health care provider about what schedule of regular examinations is right for you. This is a good chance for you to check in with your provider about disease prevention and staying healthy. In between checkups, there are plenty of things you can do on your own. Experts have done a lot of research about which lifestyle changes and preventive measures are most likely to keep you healthy. Ask your health care provider for more information. WEIGHT AND DIET  Eat a healthy diet  Be sure to include plenty of vegetables, fruits, low-fat dairy products, and lean protein.  Do not eat a lot of foods high in solid fats, added sugars, or salt.  Get regular exercise. This is one of the most important things you can do for your health.  Most adults should exercise for at least 150 minutes each week. The exercise should increase your heart rate and make you sweat (moderate-intensity exercise).  Most adults should also do strengthening exercises at least twice a week. This is in addition to the moderate-intensity exercise.  Maintain a healthy weight  Body mass index (BMI) is a measurement that can be used to identify possible weight problems. It estimates body fat based on height and weight. Your health care provider can help determine your BMI and help you achieve or maintain a healthy weight.  For females 20 years of age and older:   A BMI below 18.5 is considered underweight.  A BMI of 18.5 to 24.9 is normal.  A BMI of 25 to 29.9 is considered overweight.  A BMI of 30 and above is considered obese.  Watch levels of cholesterol and blood lipids  You should start having your blood tested for lipids and cholesterol at 47 years of age, then have this test every 5 years.  You may need to have your cholesterol levels checked more often if:  Your lipid  or cholesterol levels are high.  You are older than 47 years of age.  You are at high risk for heart disease.  CANCER SCREENING   Lung Cancer  Lung cancer screening is recommended for adults 55-80 years old who are at high risk for lung cancer because of a history of smoking.  A yearly low-dose CT scan of the lungs is recommended for people who:  Currently smoke.  Have quit within the past 15 years.  Have at least a 30-pack-year history of smoking. A pack year is smoking an average of one pack of cigarettes a day for 1 year.  Yearly screening should continue until it has been 15 years since you quit.  Yearly screening should stop if you develop a health problem that would prevent you from having lung cancer treatment.  Breast Cancer  Practice breast self-awareness. This means understanding how your breasts normally appear and feel.  It also means doing regular breast self-exams. Let your health care provider know about any changes, no matter how small.  If you are in your 20s or 30s, you should have a clinical breast exam (CBE) by a health care provider every 1-3 years as part of a regular health exam.  If you are 40 or older, have a CBE every year. Also consider having a breast X-ray (mammogram) every year.  If you have a family history of breast cancer, talk to your health care provider about genetic screening.  If you   are at high risk for breast cancer, talk to your health care provider about having an MRI and a mammogram every year.  Breast cancer gene (BRCA) assessment is recommended for women who have family members with BRCA-related cancers. BRCA-related cancers include:  Breast.  Ovarian.  Tubal.  Peritoneal cancers.  Results of the assessment will determine the need for genetic counseling and BRCA1 and BRCA2 testing. Cervical Cancer Your health care provider may recommend that you be screened regularly for cancer of the pelvic organs (ovaries, uterus, and  vagina). This screening involves a pelvic examination, including checking for microscopic changes to the surface of your cervix (Pap test). You may be encouraged to have this screening done every 3 years, beginning at age 21.  For women ages 30-65, health care providers may recommend pelvic exams and Pap testing every 3 years, or they may recommend the Pap and pelvic exam, combined with testing for human papilloma virus (HPV), every 5 years. Some types of HPV increase your risk of cervical cancer. Testing for HPV may also be done on women of any age with unclear Pap test results.  Other health care providers may not recommend any screening for nonpregnant women who are considered low risk for pelvic cancer and who do not have symptoms. Ask your health care provider if a screening pelvic exam is right for you.  If you have had past treatment for cervical cancer or a condition that could lead to cancer, you need Pap tests and screening for cancer for at least 20 years after your treatment. If Pap tests have been discontinued, your risk factors (such as having a new sexual partner) need to be reassessed to determine if screening should resume. Some women have medical problems that increase the chance of getting cervical cancer. In these cases, your health care provider may recommend more frequent screening and Pap tests. Colorectal Cancer  This type of cancer can be detected and often prevented.  Routine colorectal cancer screening usually begins at 47 years of age and continues through 47 years of age.  Your health care provider may recommend screening at an earlier age if you have risk factors for colon cancer.  Your health care provider may also recommend using home test kits to check for hidden blood in the stool.  A small camera at the end of a tube can be used to examine your colon directly (sigmoidoscopy or colonoscopy). This is done to check for the earliest forms of colorectal  cancer.  Routine screening usually begins at age 50.  Direct examination of the colon should be repeated every 5-10 years through 47 years of age. However, you may need to be screened more often if early forms of precancerous polyps or small growths are found. Skin Cancer  Check your skin from head to toe regularly.  Tell your health care provider about any new moles or changes in moles, especially if there is a change in a mole's shape or color.  Also tell your health care provider if you have a mole that is larger than the size of a pencil eraser.  Always use sunscreen. Apply sunscreen liberally and repeatedly throughout the day.  Protect yourself by wearing long sleeves, pants, a wide-brimmed hat, and sunglasses whenever you are outside. HEART DISEASE, DIABETES, AND HIGH BLOOD PRESSURE   High blood pressure causes heart disease and increases the risk of stroke. High blood pressure is more likely to develop in:  People who have blood pressure in the high end   of the normal range (130-139/85-89 mm Hg).  People who are overweight or obese.  People who are African American.  If you are 38-23 years of age, have your blood pressure checked every 3-5 years. If you are 61 years of age or older, have your blood pressure checked every year. You should have your blood pressure measured twice--once when you are at a hospital or clinic, and once when you are not at a hospital or clinic. Record the average of the two measurements. To check your blood pressure when you are not at a hospital or clinic, you can use:  An automated blood pressure machine at a pharmacy.  A home blood pressure monitor.  If you are between 45 years and 39 years old, ask your health care provider if you should take aspirin to prevent strokes.  Have regular diabetes screenings. This involves taking a blood sample to check your fasting blood sugar level.  If you are at a normal weight and have a low risk for diabetes,  have this test once every three years after 47 years of age.  If you are overweight and have a high risk for diabetes, consider being tested at a younger age or more often. PREVENTING INFECTION  Hepatitis B  If you have a higher risk for hepatitis B, you should be screened for this virus. You are considered at high risk for hepatitis B if:  You were born in a country where hepatitis B is common. Ask your health care provider which countries are considered high risk.  Your parents were born in a high-risk country, and you have not been immunized against hepatitis B (hepatitis B vaccine).  You have HIV or AIDS.  You use needles to inject street drugs.  You live with someone who has hepatitis B.  You have had sex with someone who has hepatitis B.  You get hemodialysis treatment.  You take certain medicines for conditions, including cancer, organ transplantation, and autoimmune conditions. Hepatitis C  Blood testing is recommended for:  Everyone born from [birth year] through 1967.  Anyone with known risk factors for hepatitis C. Sexually transmitted infections (STIs)  You should be screened for sexually transmitted infections (STIs) including gonorrhea and chlamydia if:  You are sexually active and are younger than 47 years of age.  You are older than 47 years of age and your health care provider tells you that you are at risk for this type of infection.  Your sexual activity has changed since you were last screened and you are at an increased risk for chlamydia or gonorrhea. Ask your health care provider if you are at risk.  If you do not have HIV, but are at risk, it may be recommended that you take a prescription medicine daily to prevent HIV infection. This is called pre-exposure prophylaxis (PrEP). You are considered at risk if:  You are sexually active and do not regularly use condoms or know the HIV status of your partner(s).  You take drugs by injection.  You are sexually  active with a partner who has HIV. Talk with your health care provider about whether you are at high risk of being infected with HIV. If you choose to begin PrEP, you should first be tested for HIV. You should then be tested every 3 months for as long as you are taking PrEP.  PREGNANCY   If you are premenopausal and you may become pregnant, ask your health care provider about preconception counseling.  If you may  become pregnant, take 400 to 800 micrograms (mcg) of folic acid every day.  If you want to prevent pregnancy, talk to your health care provider about birth control (contraception). OSTEOPOROSIS AND MENOPAUSE   Osteoporosis is a disease in which the bones lose minerals and strength with aging. This can result in serious bone fractures. Your risk for osteoporosis can be identified using a bone density scan.  If you are 38 years of age or older, or if you are at risk for osteoporosis and fractures, ask your health care provider if you should be screened.  Ask your health care provider whether you should take a calcium or vitamin D supplement to lower your risk for osteoporosis.  Menopause may have certain physical symptoms and risks.  Hormone replacement therapy may reduce some of these symptoms and risks. Talk to your health care provider about whether hormone replacement therapy is right for you.  HOME CARE INSTRUCTIONS   Schedule regular health, dental, and eye exams.  Stay current with your immunizations.   Do not use any tobacco products including cigarettes, chewing tobacco, or electronic cigarettes.  If you are pregnant, do not drink alcohol.  If you are breastfeeding, limit how much and how often you drink alcohol.  Limit alcohol intake to no more than 1 drink per day for nonpregnant women. One drink equals 12 ounces of beer, 5 ounces of wine, or 1 ounces of hard liquor.  Do not use street drugs.  Do not share needles.  Ask your health care provider for help if  you need support or information about quitting drugs.  Tell your health care provider if you often feel depressed.  Tell your health care provider if you have ever been abused or do not feel safe at home.   This information is not intended to replace advice given to you by your health care provider. Make sure you discuss any questions you have with your health care provider.   Document Released: 09/11/2010 Document Revised: 03/19/2014 Document Reviewed: 01/28/2013 Elsevier Interactive Patient Education [employer].

## 2015-08-10 ENCOUNTER — Telehealth: Payer: Self-pay | Admitting: Family Medicine

## 2015-08-10 LAB — CYTOLOGY - PAP

## 2015-08-10 NOTE — Telephone Encounter (Signed)
Please call pt: - PAP is negative, with negative HPV-->next pap 3-5 years. - still awaiting labs to be collected.

## 2015-08-11 NOTE — Telephone Encounter (Signed)
Spoke with patient reviewed PAP results.

## 2015-08-17 ENCOUNTER — Telehealth: Payer: Self-pay | Admitting: Family Medicine

## 2015-08-17 ENCOUNTER — Other Ambulatory Visit (INDEPENDENT_AMBULATORY_CARE_PROVIDER_SITE_OTHER): Payer: BLUE CROSS/BLUE SHIELD

## 2015-08-17 DIAGNOSIS — F909 Attention-deficit hyperactivity disorder, unspecified type: Secondary | ICD-10-CM

## 2015-08-17 DIAGNOSIS — Z130 Encounter for screening for diseases of the blood and blood-forming organs and certain disorders involving the immune mechanism: Secondary | ICD-10-CM

## 2015-08-17 DIAGNOSIS — Z13220 Encounter for screening for lipoid disorders: Secondary | ICD-10-CM | POA: Diagnosis not present

## 2015-08-17 DIAGNOSIS — Z124 Encounter for screening for malignant neoplasm of cervix: Secondary | ICD-10-CM | POA: Diagnosis not present

## 2015-08-17 DIAGNOSIS — Z79899 Other long term (current) drug therapy: Secondary | ICD-10-CM

## 2015-08-17 DIAGNOSIS — Z1329 Encounter for screening for other suspected endocrine disorder: Secondary | ICD-10-CM

## 2015-08-17 DIAGNOSIS — F988 Other specified behavioral and emotional disorders with onset usually occurring in childhood and adolescence: Secondary | ICD-10-CM

## 2015-08-17 LAB — CBC WITH DIFFERENTIAL/PLATELET
BASOS PCT: 0.3 % (ref 0.0–3.0)
Basophils Absolute: 0 10*3/uL (ref 0.0–0.1)
EOS ABS: 0.2 10*3/uL (ref 0.0–0.7)
Eosinophils Relative: 3.6 % (ref 0.0–5.0)
HEMATOCRIT: 38.7 % (ref 36.0–46.0)
Hemoglobin: 12.9 g/dL (ref 12.0–15.0)
LYMPHS ABS: 1.9 10*3/uL (ref 0.7–4.0)
LYMPHS PCT: 34.4 % (ref 12.0–46.0)
MCHC: 33.3 g/dL (ref 30.0–36.0)
MCV: 91.3 fl (ref 78.0–100.0)
MONO ABS: 0.7 10*3/uL (ref 0.1–1.0)
Monocytes Relative: 11.9 % (ref 3.0–12.0)
NEUTROS ABS: 2.8 10*3/uL (ref 1.4–7.7)
Neutrophils Relative %: 49.8 % (ref 43.0–77.0)
PLATELETS: 289 10*3/uL (ref 150.0–400.0)
RBC: 4.24 Mil/uL (ref 3.87–5.11)
RDW: 15.1 % (ref 11.5–15.5)
WBC: 5.6 10*3/uL (ref 4.0–10.5)

## 2015-08-17 LAB — TSH: TSH: 2.37 u[IU]/mL (ref 0.35–4.50)

## 2015-08-17 LAB — COMPREHENSIVE METABOLIC PANEL
ALBUMIN: 4.1 g/dL (ref 3.5–5.2)
ALK PHOS: 55 U/L (ref 39–117)
ALT: 11 U/L (ref 0–35)
AST: 16 U/L (ref 0–37)
BILIRUBIN TOTAL: 0.6 mg/dL (ref 0.2–1.2)
BUN: 14 mg/dL (ref 6–23)
CO2: 27 mEq/L (ref 19–32)
Calcium: 9.1 mg/dL (ref 8.4–10.5)
Chloride: 103 mEq/L (ref 96–112)
Creatinine, Ser: 0.62 mg/dL (ref 0.40–1.20)
GFR: 109.86 mL/min (ref >60.00)
GLUCOSE: 89 mg/dL (ref 70–99)
POTASSIUM: 4.8 meq/L (ref 3.5–5.1)
SODIUM: 137 meq/L (ref 135–145)
TOTAL PROTEIN: 6.7 g/dL (ref 6.0–8.3)

## 2015-08-17 LAB — LIPID PANEL
CHOL/HDL RATIO: 2
Cholesterol: 180 mg/dL (ref 0–200)
HDL: 79.7 mg/dL (ref >39.00)
LDL Cholesterol: 82 mg/dL (ref 0–99)
NONHDL: 100.44
Triglycerides: 90 mg/dL (ref 0.0–149.0)
VLDL: 18 mg/dL (ref 0.0–40.0)

## 2015-08-17 NOTE — Telephone Encounter (Signed)
Please call pt: - her labs are all normal. They look great. Results for orders placed or performed in visit on 08/17/15 (from the past 72 hour(s))  CBC w/Diff     Status: None   Collection Time: 08/17/15  8:01 AM  Result Value Ref Range   WBC 5.6 4.0 - 10.5 K/uL   RBC 4.24 3.87 - 5.11 Mil/uL   Hemoglobin 12.9 12.0 - 15.0 g/dL   HCT 38.7 36.0 - 46.0 %   MCV 91.3 78.0 - 100.0 fl   MCHC 33.3 30.0 - 36.0 g/dL   RDW 15.1 11.5 - 15.5 %   Platelets 289.0 150.0 - 400.0 K/uL   Neutrophils Relative % 49.8 43.0 - 77.0 %   Lymphocytes Relative 34.4 12.0 - 46.0 %   Monocytes Relative 11.9 3.0 - 12.0 %   Eosinophils Relative 3.6 0.0 - 5.0 %   Basophils Relative 0.3 0.0 - 3.0 %   Neutro Abs 2.8 1.4 - 7.7 K/uL   Lymphs Abs 1.9 0.7 - 4.0 K/uL   Monocytes Absolute 0.7 0.1 - 1.0 K/uL   Eosinophils Absolute 0.2 0.0 - 0.7 K/uL   Basophils Absolute 0.0 0.0 - 0.1 K/uL  Comprehensive metabolic panel     Status: None   Collection Time: 08/17/15  8:01 AM  Result Value Ref Range   Sodium 137 135 - 145 mEq/L   Potassium 4.8 3.5 - 5.1 mEq/L   Chloride 103 96 - 112 mEq/L   CO2 27 19 - 32 mEq/L   Glucose, Bld 89 70 - 99 mg/dL   BUN 14 6 - 23 mg/dL   Creatinine, Ser 0.62 0.40 - 1.20 mg/dL   Total Bilirubin 0.6 0.2 - 1.2 mg/dL   Alkaline Phosphatase 55 39 - 117 U/L   AST 16 0 - 37 U/L   ALT 11 0 - 35 U/L   Total Protein 6.7 6.0 - 8.3 g/dL   Albumin 4.1 3.5 - 5.2 g/dL   Calcium 9.1 8.4 - 10.5 mg/dL   GFR 109.86 >60.00 mL/min  TSH     Status: None   Collection Time: 08/17/15  8:01 AM  Result Value Ref Range   TSH 2.37 0.35 - 4.50 uIU/mL  Lipid panel     Status: None   Collection Time: 08/17/15  8:01 AM  Result Value Ref Range   Cholesterol 180 0 - 200 mg/dL    Comment: ATP III Classification       Desirable:  < 200 mg/dL               Borderline High:  200 - 239 mg/dL          High:  > = 240 mg/dL   Triglycerides 90.0 0.0 - 149.0 mg/dL    Comment: Normal:  <150 mg/dLBorderline High:  150 - 199 mg/dL    HDL 79.70 >39.00 mg/dL   VLDL 18.0 0.0 - 40.0 mg/dL   LDL Cholesterol 82 0 - 99 mg/dL   Total CHOL/HDL Ratio 2     Comment:                Men          Women1/2 Average Risk     3.4          3.3Average Risk          5.0          4.42X Average Risk          9.6  7.13X Average Risk          15.0          11.0                       NonHDL 100.44     Comment: NOTE:  Non-HDL goal should be 30 mg/dL higher than patient's LDL goal (i.e. LDL goal of < 70 mg/dL, would have non-HDL goal of < 100 mg/dL)

## 2015-08-18 NOTE — Telephone Encounter (Signed)
Left message with patient lab results on voice mail.

## 2016-04-27 ENCOUNTER — Telehealth: Payer: Self-pay | Admitting: Family Medicine

## 2016-04-27 NOTE — Telephone Encounter (Signed)
Patients insurance for RX has changed. For atomoxetine (STRATTERA) 25 MG capsule [id number] , [facility]... [facility] bin# [id number] ID# [id number]

## 2016-04-27 NOTE — Telephone Encounter (Signed)
Received request for refills on Strattera 25 mg capsules, pt has not been seen in >9 months, she would need to be seen to continue to refill this medication for her.

## 2016-04-27 NOTE — Telephone Encounter (Signed)
Dr. Kuneff pt.  

## 2016-04-27 NOTE — Telephone Encounter (Signed)
Tried to contact patient voice mail is full. Patient needs an appt for refills.

## 2016-04-30 NOTE — Telephone Encounter (Signed)
Left message for patient she needs office visit for refills on strattera.

## 2016-05-09 ENCOUNTER — Ambulatory Visit (INDEPENDENT_AMBULATORY_CARE_PROVIDER_SITE_OTHER): Payer: BLUE CROSS/BLUE SHIELD | Admitting: Family Medicine

## 2016-05-09 ENCOUNTER — Encounter: Payer: Self-pay | Admitting: Family Medicine

## 2016-05-09 DIAGNOSIS — F988 Other specified behavioral and emotional disorders with onset usually occurring in childhood and adolescence: Secondary | ICD-10-CM

## 2016-05-09 MED ORDER — ATOMOXETINE HCL 25 MG PO CAPS: 25.0000 mg | ORAL_CAPSULE | Freq: Two times a day (BID) | ORAL | 1 refills | Status: DC

## 2016-05-09 NOTE — Patient Instructions (Signed)
Follow up every 6 months on ADD.   Yearly on CPE.

## 2016-05-09 NOTE — Progress Notes (Signed)
Lindsay Burns , [birth year], 46 y.o., female MRN: [id number] Patient Care Team    Relationship Specialty Notifications Start End  Ma Hillock, DO PCP - General Family Medicine  11/25/14     CC: ADD Subjective:  ADD (attention deficit disorder) She has been doing well on Strattera. She has continued to forget afternoon doses at times. She denies any unintentional weight loss. She has been working out 5x a week. She denies any side effects. She does feel she has the ability to focus better on the medications.   No Known Allergies Social History  Substance Use Topics  . Smoking status: Never Smoker  . Smokeless tobacco: Never Used  . Alcohol use 0.6 oz/week    1 Standard drinks or equivalent per week     Comment: most fridays will have 1 drink    Past Medical History:  Diagnosis Date  . ADD (attention deficit disorder)    dx: 2006  . B12 deficiency   . Basal cell carcinoma    left side of face  . Sleep disorder    Past Surgical History:  Procedure Laterality Date  . BASAL CELL CARCINOMA EXCISION Left    left face  . BREAST ENHANCEMENT SURGERY Bilateral    1999  . DILATION AND CURETTAGE OF UTERUS  2004  . TEMPOROMANDIBULAR JOINT SURGERY Bilateral 1987   Family History  Problem Relation Age of Onset  . Alcohol abuse Mother     was in [place]  . Breast cancer Mother 67  . Depression Mother   . Alcohol abuse Father     was in [place]  . Lung cancer Father   . Brain cancer Father   . Breast cancer Paternal Grandmother    Allergies as of 05/09/2016   No Known Allergies     Medication List       Accurate as of 05/09/16  1:58 PM. Always use your most recent med list.          atomoxetine 25 MG capsule Commonly known as:  STRATTERA Take 1 capsule (25 mg total) by mouth 2 (two) times daily.   loratadine 10 MG tablet Commonly known as:  CLARITIN Take 10 mg by mouth daily.       No results found for this or any previous visit (from the past 24 hour(s)). No  results found.   ROS: Negative, with the exception of above mentioned in HPI   Objective:  BP 100/69 (BP Location: Right Arm, Patient Position: Sitting, Cuff Size: Normal)   Pulse 84   Temp 98.2 F (36.8 C)   Resp 20   Ht 5\' 7"  (1.702 m)   Wt 128 lb 4 oz (58.2 kg)   SpO2 98%   BMI 20.09 kg/m  Body mass index is 20.09 kg/m. Gen: Afebrile. No acute distress. Nontoxic in appearance, well developed, well nourished.  HENT: AT. [place].MMM Eyes:Pupils Equal Round Reactive to light, Extraocular movements intact,  Conjunctiva without redness, discharge or icterus. CV: RRR  Chest: CTAB, no wheeze or crackles. Good air movement, normal resp effort.  Abd: Soft. NTND. BS present Neuro: Normal gait. PERLA. EOMi. Alert. Oriented x3  Psych: Normal affect, dress and demeanor. Normal speech. Normal thought content and judgment.  Assessment/Plan: Lindsay Burns is a 46 y.o. female present for acute OV for  Attention deficit disorder (ADD) without hyperactivity - refills provided today for 6 mos. Set phone alarm to remember afternoon dose. - she has lost 4 lbs, however she is working  out 5 x a week now.   - atomoxetine (STRATTERA) 25 MG capsule; Take 1 capsule (25 mg total) by mouth 2 (two) times daily.  Dispense: 180 capsule; Refill: 1 - F/U 6 mos  electronically signed by:  Howard Pouch, DO  [place]

## 2016-07-11 ENCOUNTER — Telehealth: Payer: Self-pay | Admitting: *Deleted

## 2016-07-11 NOTE — Telephone Encounter (Signed)
Patient husband called and states his insurance changed and medications need [facility]. Added pharmacy to patient list. Advised husband [facility] can [facility]

## 2016-11-14 ENCOUNTER — Ambulatory Visit (INDEPENDENT_AMBULATORY_CARE_PROVIDER_SITE_OTHER): Payer: BLUE CROSS/BLUE SHIELD | Admitting: Family Medicine

## 2016-11-14 ENCOUNTER — Encounter: Payer: Self-pay | Admitting: Family Medicine

## 2016-11-14 VITALS — BP 98/62 | HR 86 | Temp 98.2°F | Wt 132.4 lb

## 2016-11-14 DIAGNOSIS — B354 Tinea corporis: Secondary | ICD-10-CM

## 2016-11-14 DIAGNOSIS — B351 Tinea unguium: Secondary | ICD-10-CM

## 2016-11-14 LAB — COMPREHENSIVE METABOLIC PANEL
ALT: 8 U/L (ref 0–35)
AST: 15 U/L (ref 0–37)
Albumin: 4.2 g/dL (ref 3.5–5.2)
Alkaline Phosphatase: 50 U/L (ref 39–117)
BILIRUBIN TOTAL: 0.3 mg/dL (ref 0.2–1.2)
BUN: 13 mg/dL (ref 6–23)
CO2: 27 meq/L (ref 19–32)
Calcium: 9.3 mg/dL (ref 8.4–10.5)
Chloride: 102 mEq/L (ref 96–112)
Creatinine, Ser: 0.64 mg/dL (ref 0.40–1.20)
GFR: 105.35 mL/min (ref >60.00)
Glucose, Bld: 95 mg/dL (ref 70–99)
POTASSIUM: 3.7 meq/L (ref 3.5–5.1)
Sodium: 137 mEq/L (ref 135–145)
Total Protein: 6.9 g/dL (ref 6.0–8.3)

## 2016-11-14 LAB — CBC
HCT: 36.4 % (ref 36.0–46.0)
HEMOGLOBIN: 12 g/dL (ref 12.0–15.0)
MCHC: 32.8 g/dL (ref 30.0–36.0)
MCV: 92.5 fl (ref 78.0–100.0)
Platelets: 295 10*3/uL (ref 150.0–400.0)
RBC: 3.94 Mil/uL (ref 3.87–5.11)
RDW: 14.7 % (ref 11.5–15.5)
WBC: 5 10*3/uL (ref 4.0–10.5)

## 2016-11-14 MED ORDER — TERBINAFINE HCL 250 MG PO TABS: 250.0000 mg | ORAL_TABLET | Freq: Every day | ORAL | 0 refills | Status: DC

## 2016-11-14 NOTE — Progress Notes (Signed)
   Subjective:    Patient ID: Lindsay Burns, female    DOB: 1970/09/16, 46 y.o.   MRN: [id number]  HPI This is a 46 yo female who presents today with ringworm of left forearm. 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 Her family has been affected also (children on oral and topical treatment). Rash x 2-3 weeks.  Her skin is generally very sensitive and gets red and irritated easily.  Has tried ketaconazole (caused burning, redness), clotrimazole (irritated skin), some improvement with topical lamisil 1-2 times a day for last 3 days.  She has recurrent toenail fungus on her left great toe. Took a course of terbinifine about 5 years ago with clearing for about 3 years. She would like to treat tinea corpis and onychomycosis with terbinafine.  Past Medical History:  Diagnosis Date  . ADD (attention deficit disorder)    dx: 2006  . B12 deficiency   . Basal cell carcinoma    left side of face  . Sleep disorder    Past Surgical History:  Procedure Laterality Date  . BASAL CELL CARCINOMA EXCISION Left    left face  . BREAST ENHANCEMENT SURGERY Bilateral    1999  . DILATION AND CURETTAGE OF UTERUS  2004  . TEMPOROMANDIBULAR JOINT SURGERY Bilateral 1987    Review of Systems Per HPI    Objective:   Physical Exam  Constitutional: She is oriented to person, place, and time. She appears well-developed and well-nourished.  HENT:  Head: Normocephalic and atraumatic.  Cardiovascular: Normal rate.   Pulmonary/Chest: Effort normal.  Neurological: She is alert and oriented to person, place, and time.  Skin: Skin is warm and dry.     Left great toenail thickened, discolored.   Psychiatric: She has a normal mood and affect. Her behavior is normal. Judgment and thought content normal.  Vitals reviewed.     BP 98/62 (BP Location: Right Arm, Patient Position: Sitting, Cuff Size: Normal)   Pulse 86   Temp 98.2 F (36.8 C) (Oral)   Wt 132 lb 6.4 oz  (60.1 kg)   LMP 11/11/2016   SpO2 97%   BMI 20.74 kg/m  Wt Readings from Last 3 Encounters:  11/14/16 132 lb 6.4 oz (60.1 kg)  05/09/16 128 lb 4 oz (58.2 kg)  08/09/15 132 lb (59.9 kg)       Assessment & Plan:  1. Tinea corporis - continue topical treatment and add terbinafine for both conditions, will check labs since none for greater than 1 year - CBC - Comprehensive metabolic panel - terbinafine (LAMISIL) 250 MG tablet; Take 1 tablet (250 mg total) by mouth daily.  Dispense: 84 tablet; Refill: 0 - Provided written and verbal information regarding diagnosis and treatment. - RTC precautions reviewed  2. Onychomycosis of toenail - CBC - Comprehensive metabolic panel - terbinafine (LAMISIL) 250 MG tablet; Take 1 tablet (250 mg total) by mouth daily.  Dispense: 84 tablet; Refill: 0   Clarene Reamer, FNP-BC  [facility]  11/14/2016 3:34 PM

## 2016-11-14 NOTE — Patient Instructions (Addendum)
Please call and schedule an appointment for screening mammogram. A referral is not needed.  [phone]   If rash not improved in 5-7 days, please follow up.   Continue lamisil twice a day for up to 14 days.    Body Ringworm Body ringworm is an infection of the skin that often causes a ring-shaped rash. Body ringworm can affect any part of your skin. It can spread easily to others. Body ringworm is also called tinea corporis. What are the causes? This condition is caused by funguses called dermatophytes. The condition develops when these funguses grow out of control on the skin. You can get this condition if you touch a person or animal that has it. You can also get it if you share clothing, bedding, towels, or any other object with an infected person or pet. What increases the risk? This condition is more likely to develop in:  Athletes who often make skin-to-skin contact with other athletes, such as wrestlers.  People who share equipment and mats.  People with a weakened immune system.  What are the signs or symptoms? Symptoms of this condition include:  Itchy, raised red spots and bumps.  Red scaly patches.  A ring-shaped rash. The rash may have: ? A clear center. ? Scales or red bumps at its center. ? Redness near its borders. ? Dry and scaly skin on or around it.  How is this diagnosed? This condition can usually be diagnosed with a skin exam. A skin scraping may be taken from the affected area and examined under a microscope to see if the fungus is present. How is this treated? This condition may be treated with:  An antifungal cream or ointment.  An antifungal shampoo.  Antifungal medicines. These may be prescribed if your ringworm is severe, keeps coming back, or lasts a long time.  Follow these instructions at home:  Take over-the-counter and prescription medicines only as told by your health care provider.  If you were given an antifungal cream or  ointment: ? Use it as told by your health care provider. ? Wash the infected area and dry it completely before applying the cream or ointment.  If you were given an antifungal shampoo: ? Use it as told by your health care provider. ? Leave the shampoo on your body for 3-5 minutes before rinsing.  While you have a rash: ? Wear loose clothing to stop clothes from rubbing and irritating it. ? Wash or change your bed sheets every night.  If your pet has the same infection, take your pet to see a Animal nutritionist. How is this prevented?  Practice good hygiene.  Wear sandals or shoes in public places and showers.  Do not share personal items with others.  Avoid touching red patches of skin on other people.  Avoid touching pets that have bald spots.  If you touch an animal that has a bald spot, wash your hands. Contact a health care provider if:  Your rash continues to spread after 7 days of treatment.  Your rash is not gone in 4 weeks.  The area around your rash gets red, warm, tender, and swollen. This information is not intended to replace advice given to you by your health care provider. Make sure you discuss any questions you have with your health care provider. Document Released: 02/24/2000 Document Revised: 08/04/2015 Document Reviewed: 12/23/2014 Elsevier Interactive Patient Education  2018 [place].  Fungal Nail Infection Fungal nail infection is a common fungal infection of the  toenails or fingernails. This condition affects toenails more often than fingernails. More than one nail may be infected. The condition can be passed from person to person (is contagious). What are the causes? This condition is caused by a fungus. Several types of funguses can cause the infection. These funguses are common in moist and warm areas. If your hands or feet come into contact with the fungus, it may get into a crack in your fingernail or toenail and cause the infection. What increases  the risk? The following factors may make you more likely to develop this condition:  Being female.  Having diabetes.  Being of older age.  Living with someone who has the fungus.  Walking barefoot in areas where the fungus thrives, such as showers or locker rooms.  Having poor circulation.  Wearing shoes and socks that cause your feet to sweat.  Having athlete's foot.  Having a nail injury or history of a recent nail surgery.  Having psoriasis.  Having a weak body defense system (immune system).  What are the signs or symptoms? Symptoms of this condition include:  A pale spot on the nail.  Thickening of the nail.  A nail that becomes yellow or brown.  A brittle or ragged nail edge.  A crumbling nail.  A nail that has lifted away from the nail bed.  How is this diagnosed? This condition is diagnosed with a physical exam. Your health care provider may take a scraping or clipping from your nail to test for the fungus. How is this treated? Mild infections do not need treatment. If you have significant nail changes, treatment may include:  Oral antifungal medicines. You may need to take the medicine for several weeks or several months, and you may not see the results for a long time. These medicines can cause side effects. Ask your health care provider what problems to watch for.  Antifungal nail polish and nail cream. These may be used along with oral antifungal medicines.  Laser treatment of the nail.  Surgery to remove the nail. This may be needed for the most severe infections.  Treatment takes a long time, and the infection may come back. Follow these instructions at home: Medicines  Take or apply over-the-counter and prescription medicines only as told by your health care provider.  Ask your health care provider about using over-the-counter mentholated ointment on your nails. Lifestyle   Do not share personal items, such as towels or nail  clippers.  Trim your nails often.  Wash and dry your hands and feet every day.  Wear absorbent socks, and change your socks frequently.  Wear shoes that allow air to circulate, such as sandals or canvas tennis shoes. Throw out old shoes.  Wear rubber gloves if you are working with your hands in wet areas.  Do not walk barefoot in shower rooms or locker rooms.  Do not use a nail salon that does not use clean instruments.  Do not use artificial nails. General instructions  Keep all follow-up visits as told by your health care provider. This is important.  Use antifungal foot powder on your feet and in your shoes. Contact a health care provider if: Your infection is not getting better or it is getting worse after several months. This information is not intended to replace advice given to you by your health care provider. Make sure you discuss any questions you have with your health care provider. Document Released: 02/24/2000 Document Revised: 08/04/2015 Document Reviewed: 08/30/2014 Elsevier  Interactive Patient Education  [employer].

## 2017-05-14 DIAGNOSIS — L821 Other seborrheic keratosis: Secondary | ICD-10-CM | POA: Diagnosis not present

## 2017-05-14 DIAGNOSIS — Z08 Encounter for follow-up examination after completed treatment for malignant neoplasm: Secondary | ICD-10-CM | POA: Diagnosis not present

## 2017-05-14 DIAGNOSIS — Z85828 Personal history of other malignant neoplasm of skin: Secondary | ICD-10-CM | POA: Diagnosis not present

## 2017-12-31 ENCOUNTER — Other Ambulatory Visit: Payer: Self-pay | Admitting: Family Medicine

## 2017-12-31 DIAGNOSIS — Z1231 Encounter for screening mammogram for malignant neoplasm of breast: Secondary | ICD-10-CM

## 2018-01-28 ENCOUNTER — Ambulatory Visit: Payer: BLUE CROSS/BLUE SHIELD | Admitting: Family Medicine

## 2018-01-28 ENCOUNTER — Encounter: Payer: Self-pay | Admitting: Family Medicine

## 2018-01-28 VITALS — BP 98/64 | HR 82 | Temp 98.0°F | Resp 20 | Ht 67.0 in | Wt 129.2 lb

## 2018-01-28 DIAGNOSIS — H6981 Other specified disorders of Eustachian tube, right ear: Secondary | ICD-10-CM

## 2018-01-28 MED ORDER — FLUTICASONE PROPIONATE 50 MCG/ACT NA SUSP: 2.0000 | Freq: Every day | NASAL | 6 refills | Status: DC

## 2018-01-28 NOTE — Patient Instructions (Signed)
Flonase nasal spray use once daily.  Perform the Galbreath maneuver a few times as day as instructed.    Eustachian Tube Dysfunction The eustachian tube connects the middle ear to the back of the nose. It regulates air pressure in the middle ear by allowing air to move between the ear and nose. It also helps to drain fluid from the middle ear space. When the eustachian tube does not function properly, air pressure, fluid, or both can build up in the middle ear. Eustachian tube dysfunction can affect one or both ears. What are the causes? This condition happens when the eustachian tube becomes blocked or cannot open normally. This may result from:  Ear infections.  Colds and other upper respiratory infections.  Allergies.  Irritation, such as from cigarette smoke or acid from the stomach coming up into the esophagus (gastroesophageal reflux).  Sudden changes in air pressure, such as from descending in an airplane.  Abnormal growths in the nose or throat, such as nasal polyps, tumors, or enlarged tissue at the back of the throat (adenoids).  What increases the risk? This condition may be more likely to develop in people who smoke and people who are overweight. Eustachian tube dysfunction may also be more likely to develop in children, especially children who have:  Certain birth defects of the mouth, such as cleft palate.  Large tonsils and adenoids.  What are the signs or symptoms? Symptoms of this condition may include:  A feeling of fullness in the ear.  Ear pain.  Clicking or popping noises in the ear.  Ringing in the ear.  Hearing loss.  Loss of balance.  Symptoms may get worse when the air pressure around you changes, such as when you travel to an area of high elevation or fly on an airplane. How is this diagnosed? This condition may be diagnosed based on:  Your symptoms.  A physical exam of your ear, nose, and throat.  Tests, such as those that  measure: ? The movement of your eardrum (tympanogram). ? Your hearing (audiometry).  How is this treated? Treatment depends on the cause and severity of your condition. If your symptoms are mild, you may be able to relieve your symptoms by moving air into ("popping") your ears. If you have symptoms of fluid in your ears, treatment may include:  Decongestants.  Antihistamines.  Nasal sprays or ear drops that contain medicines that reduce swelling (steroids).  In some cases, you may need to have a procedure to drain the fluid in your eardrum (myringotomy). In this procedure, a small tube is placed in the eardrum to:  Drain the fluid.  Restore the air in the middle ear space.  Follow these instructions at home:  Take over-the-counter and prescription medicines only as told by your health care provider.  Use techniques to help pop your ears as recommended by your health care provider. These may include: ? Chewing gum. ? Yawning. ? Frequent, forceful swallowing. ? Closing your mouth, holding your nose closed, and gently blowing as if you are trying to blow air out of your nose.  Do not do any of the following until your health care provider approves: ? Travel to high altitudes. ? Fly in airplanes. ? Work in a Pension scheme manager or room. ? Scuba dive.  Keep your ears dry. Dry your ears completely after showering or bathing.  Do not smoke.  Keep all follow-up visits as told by your health care provider. This is important. Contact a health care  provider if:  Your symptoms do not go away after treatment.  Your symptoms come back after treatment.  You are unable to pop your ears.  You have: ? A fever. ? Pain in your ear. ? Pain in your head or neck. ? Fluid draining from your ear.  Your hearing suddenly changes.  You become very dizzy.  You lose your balance. This information is not intended to replace advice given to you by your health care provider. Make sure you  discuss any questions you have with your health care provider. Document Released: 03/25/2015 Document Revised: 08/04/2015 Document Reviewed: 03/17/2014 Elsevier Interactive Patient Education  [employer].

## 2018-01-28 NOTE — Progress Notes (Signed)
Lindsay Burns , [birth year], 47 y.o., female MRN: [id number] Patient Care Team    Relationship Specialty Notifications Start End  Ma Hillock, DO PCP - General Family Medicine  11/25/14     Chief Complaint  Patient presents with  . Ear Pain    right x 2 weeks     Subjective: Pt presents for an OV with complaints of right ear pain of 2 days duration.  Associated symptoms include bilateral intermittent ear fullness and "water in ear." She had mils nausea today. She denies fever, chills, vomit, sinus pressure, sore throat or headache.   Pt has tried zyrtec to ease their symptoms.   Depression screen [facility] 2/9 01/28/2018 08/09/2015 11/25/2014  Decreased Interest 0 0 0  Down, Depressed, Hopeless 0 0 0  PHQ - 2 Score 0 0 0    No Known Allergies Social History   Tobacco Use  . Smoking status: Never Smoker  . Smokeless tobacco: Never Used  Substance Use Topics  . Alcohol use: Yes    Alcohol/week: 1.0 standard drinks    Types: 1 Standard drinks or equivalent per week    Comment: most fridays will have 1 drink    Past Medical History:  Diagnosis Date  . ADD (attention deficit disorder)    dx: 2006  . B12 deficiency   . Basal cell carcinoma    left side of face  . Sleep disorder    Past Surgical History:  Procedure Laterality Date  . BASAL CELL CARCINOMA EXCISION Left    left face  . BREAST ENHANCEMENT SURGERY Bilateral    1999  . DILATION AND CURETTAGE OF UTERUS  2004  . TEMPOROMANDIBULAR JOINT SURGERY Bilateral 1987   Family History  Problem Relation Age of Onset  . Alcohol abuse Mother        was in [place]  . Breast cancer Mother 26  . Depression Mother   . Alcohol abuse Father        was in [place]  . Lung cancer Father   . Brain cancer Father   . Breast cancer Paternal Grandmother    Allergies as of 01/28/2018   No Known Allergies     Medication List        Accurate as of 01/28/18  3:17 PM. Always use your most recent med list.            atomoxetine 25 MG capsule Commonly known as:  STRATTERA Take 1 capsule (25 mg total) by mouth 2 (two) times daily.   loratadine 10 MG tablet Commonly known as:  CLARITIN Take 10 mg by mouth daily.       All past medical history, surgical history, allergies, family history, immunizations andmedications were updated in the EMR today and reviewed under the history and medication portions of their EMR.     ROS: Negative, with the exception of above mentioned in HPI   Objective:  BP 98/64 (BP Location: Right Arm, Patient Position: Sitting, Cuff Size: Normal)   Pulse 82   Temp 98 F (36.7 C)   Resp 20   Ht 5\' 7"  (1.702 m)   Wt 129 lb 4 oz (58.6 kg)   LMP 01/22/2018   SpO2 100%   BMI 20.24 kg/m  Body mass index is 20.24 kg/m. Gen: Afebrile. No acute distress. Nontoxic in appearance, well developed, well nourished.  HENT: AT. [place]. Bilateral TM visualized with right fullness/fluid, no erythema. MMM, no oral lesions. Bilateral nares WNL. Throat without erythema or  exudates. No cough. Eyes:Pupils Equal Round Reactive to light, Extraocular movements intact,  Conjunctiva without redness, discharge or icterus. Neck/lymp/endocrine: Supple,mild right cervical  lymphadenopathy CV: RRR  Chest: CTAB, no wheeze or crackles. Good air movement, normal resp effort.  Neuro: Normal gait. PERLA. EOMi. Alert. Oriented x3  No exam data present No results found. No results found for this or any previous visit (from the past 24 hour(s)).  Assessment/Plan: Lindsay Burns is a 47 y.o. female present for OV for  Eustachian tube dysfunction, right Galbreath maneuver demonstrated for her Flonase nasal spray daily.  No signs of infection F/U PRN   Reviewed expectations re: course of current medical issues.  Discussed self-management of symptoms.  Outlined signs and symptoms indicating need for more acute intervention.  Patient verbalized understanding and all questions were  answered.  Patient received an After-Visit Summary.    No orders of the defined types were placed in this encounter.    Note is dictated utilizing voice recognition software. Although note has been proof read prior to signing, occasional typographical errors still can be missed. If any questions arise, please do not hesitate to call for verification.   electronically signed by:  Howard Pouch, DO  [place]

## 2018-01-30 ENCOUNTER — Ambulatory Visit: Payer: BLUE CROSS/BLUE SHIELD | Admitting: Family Medicine

## 2018-02-10 ENCOUNTER — Ambulatory Visit
Admission: RE | Admit: 2018-02-10 | Discharge: 2018-02-10 | Disposition: A | Discharge status: Home / Self Care | Payer: BLUE CROSS/BLUE SHIELD | Source: Ambulatory Visit | Attending: Family Medicine | Admitting: Family Medicine

## 2018-02-10 DIAGNOSIS — Z1231 Encounter for screening mammogram for malignant neoplasm of breast: Secondary | ICD-10-CM | POA: Diagnosis not present

## 2018-02-11 ENCOUNTER — Other Ambulatory Visit: Payer: Self-pay | Admitting: Family Medicine

## 2018-02-11 DIAGNOSIS — R928 Other abnormal and inconclusive findings on diagnostic imaging of breast: Secondary | ICD-10-CM

## 2018-02-14 ENCOUNTER — Ambulatory Visit
Admission: RE | Admit: 2018-02-14 | Discharge: 2018-02-14 | Disposition: A | Discharge status: Home / Self Care | Payer: BLUE CROSS/BLUE SHIELD | Source: Ambulatory Visit | Attending: Family Medicine | Admitting: Family Medicine

## 2018-02-14 DIAGNOSIS — R928 Other abnormal and inconclusive findings on diagnostic imaging of breast: Secondary | ICD-10-CM

## 2018-02-14 DIAGNOSIS — N6001 Solitary cyst of right breast: Secondary | ICD-10-CM | POA: Diagnosis not present

## 2018-02-14 DIAGNOSIS — R922 Inconclusive mammogram: Secondary | ICD-10-CM | POA: Diagnosis not present

## 2018-05-28 ENCOUNTER — Encounter: Payer: BLUE CROSS/BLUE SHIELD | Admitting: Family Medicine

## 2018-08-12 ENCOUNTER — Telehealth: Payer: Self-pay | Admitting: Family Medicine

## 2018-08-12 ENCOUNTER — Other Ambulatory Visit: Payer: Self-pay

## 2018-08-12 ENCOUNTER — Ambulatory Visit (INDEPENDENT_AMBULATORY_CARE_PROVIDER_SITE_OTHER): Payer: BLUE CROSS/BLUE SHIELD | Admitting: Family Medicine

## 2018-08-12 ENCOUNTER — Encounter: Payer: Self-pay | Admitting: Family Medicine

## 2018-08-12 VITALS — BP 103/69 | HR 68 | Temp 97.9°F | Resp 18 | Ht 66.75 in | Wt 137.4 lb

## 2018-08-12 DIAGNOSIS — Z79899 Other long term (current) drug therapy: Secondary | ICD-10-CM | POA: Diagnosis not present

## 2018-08-12 DIAGNOSIS — E538 Deficiency of other specified B group vitamins: Secondary | ICD-10-CM | POA: Diagnosis not present

## 2018-08-12 DIAGNOSIS — Z13220 Encounter for screening for lipoid disorders: Secondary | ICD-10-CM | POA: Diagnosis not present

## 2018-08-12 DIAGNOSIS — Z0000 Encounter for general adult medical examination without abnormal findings: Secondary | ICD-10-CM | POA: Diagnosis not present

## 2018-08-12 DIAGNOSIS — G479 Sleep disorder, unspecified: Secondary | ICD-10-CM | POA: Diagnosis not present

## 2018-08-12 DIAGNOSIS — Z1211 Encounter for screening for malignant neoplasm of colon: Secondary | ICD-10-CM

## 2018-08-12 DIAGNOSIS — Z131 Encounter for screening for diabetes mellitus: Secondary | ICD-10-CM | POA: Diagnosis not present

## 2018-08-12 DIAGNOSIS — R5383 Other fatigue: Secondary | ICD-10-CM

## 2018-08-12 DIAGNOSIS — Z1239 Encounter for other screening for malignant neoplasm of breast: Secondary | ICD-10-CM

## 2018-08-12 DIAGNOSIS — Z130 Encounter for screening for diseases of the blood and blood-forming organs and certain disorders involving the immune mechanism: Secondary | ICD-10-CM | POA: Diagnosis not present

## 2018-08-12 DIAGNOSIS — F988 Other specified behavioral and emotional disorders with onset usually occurring in childhood and adolescence: Secondary | ICD-10-CM | POA: Diagnosis not present

## 2018-08-12 LAB — TSH: TSH: 1.59 u[IU]/mL (ref 0.35–4.50)

## 2018-08-12 LAB — COMPREHENSIVE METABOLIC PANEL
ALT: 14 U/L (ref 0–35)
AST: 16 U/L (ref 0–37)
Albumin: 4 g/dL (ref 3.5–5.2)
Alkaline Phosphatase: 58 U/L (ref 39–117)
BUN: 13 mg/dL (ref 6–23)
CO2: 28 mEq/L (ref 19–32)
Calcium: 9.4 mg/dL (ref 8.4–10.5)
Chloride: 103 mEq/L (ref 96–112)
Creatinine, Ser: 0.57 mg/dL (ref 0.40–1.20)
GFR: 112.47 mL/min (ref >60.00)
Glucose, Bld: 84 mg/dL (ref 70–99)
Potassium: 4.7 mEq/L (ref 3.5–5.1)
Sodium: 138 mEq/L (ref 135–145)
Total Bilirubin: 0.5 mg/dL (ref 0.2–1.2)
Total Protein: 6.8 g/dL (ref 6.0–8.3)

## 2018-08-12 LAB — LIPID PANEL
Cholesterol: 191 mg/dL (ref 0–200)
HDL: 70.3 mg/dL (ref >39.00)
LDL Cholesterol: 99 mg/dL (ref 0–99)
NonHDL: 121.14
Total CHOL/HDL Ratio: 3
Triglycerides: 109 mg/dL (ref 0.0–149.0)
VLDL: 21.8 mg/dL (ref 0.0–40.0)

## 2018-08-12 LAB — HEMOGLOBIN A1C: Hgb A1c MFr Bld: 5.1 % (ref 4.6–6.5)

## 2018-08-12 LAB — CBC
HCT: 41.8 % (ref 36.0–46.0)
Hemoglobin: 14.3 g/dL (ref 12.0–15.0)
MCHC: 34.3 g/dL (ref 30.0–36.0)
MCV: 96.2 fl (ref 78.0–100.0)
Platelets: 288 10*3/uL (ref 150.0–400.0)
RBC: 4.34 Mil/uL (ref 3.87–5.11)
RDW: 13.4 % (ref 11.5–15.5)
WBC: 6.3 10*3/uL (ref 4.0–10.5)

## 2018-08-12 LAB — VITAMIN B12: Vitamin B-12: 600 pg/mL (ref 211–911)

## 2018-08-12 LAB — VITAMIN D 25 HYDROXY (VIT D DEFICIENCY, FRACTURES): VITD: 18.14 ng/mL — ABNORMAL LOW (ref 30.00–100.00)

## 2018-08-12 MED ORDER — ATOMOXETINE HCL 40 MG PO CAPS: 40.0000 mg | ORAL_CAPSULE | Freq: Every day | ORAL | 1 refills | Status: DC

## 2018-08-12 MED ORDER — VITAMIN D (ERGOCALCIFEROL) 1.25 MG (50000 UNIT) PO CAPS: 50000.0000 [IU] | ORAL_CAPSULE | ORAL | 0 refills | Status: DC

## 2018-08-12 NOTE — Patient Instructions (Addendum)
Follow up in 6 mos on your ADD. Your pill is now once a day at higher dose  Follow yearly for physical.  Make sure Vit d in supplement is 400-600. Shingrix after 50= just call in a nurse visit.  Mammogram after xmas and gastroenterologist after 50 years old. Placed both referrals today  Health Maintenance, Female Adopting a healthy lifestyle and getting preventive care can go a long way to promote health and wellness. Talk with your health care provider about what schedule of regular examinations is right for you. This is a good chance for you to check in with your provider about disease prevention and staying healthy. In between checkups, there are plenty of things you can do on your own. Experts have done a lot of research about which lifestyle changes and preventive measures are most likely to keep you healthy. Ask your health care provider for more information. Weight and diet Eat a healthy diet  Be sure to include plenty of vegetables, fruits, low-fat dairy products, and lean protein.  Do not eat a lot of foods high in solid fats, added sugars, or salt.  Get regular exercise. This is one of the most important things you can do for your health. ? Most adults should exercise for at least 150 minutes each week. The exercise should increase your heart rate and make you sweat (moderate-intensity exercise). ? Most adults should also do strengthening exercises at least twice a week. This is in addition to the moderate-intensity exercise. Maintain a healthy weight  Body mass index (BMI) is a measurement that can be used to identify possible weight problems. It estimates body fat based on height and weight. Your health care provider can help determine your BMI and help you achieve or maintain a healthy weight.  For females 53 years of age and older: ? A BMI below 18.5 is considered underweight. ? A BMI of 18.5 to 24.9 is normal. ? A BMI of 25 to 29.9 is considered overweight. ? A BMI of 30 and  above is considered obese. Watch levels of cholesterol and blood lipids  You should start having your blood tested for lipids and cholesterol at 50 years of age, then have this test every 5 years.  You may need to have your cholesterol levels checked more often if: ? Your lipid or cholesterol levels are high. ? You are older than 50 years of age. ? You are at high risk for heart disease. Cancer screening Lung Cancer  Lung cancer screening is recommended for adults 13-31 years old who are at high risk for lung cancer because of a history of smoking.  A yearly low-dose CT scan of the lungs is recommended for people who: ? Currently smoke. ? Have quit within the past 15 years. ? Have at least a 30-pack-year history of smoking. A pack year is smoking an average of one pack of cigarettes a day for 1 year.  Yearly screening should continue until it has been 15 years since you quit.  Yearly screening should stop if you develop a health problem that would prevent you from having lung cancer treatment. Breast Cancer  Practice breast self-awareness. This means understanding how your breasts normally appear and feel.  It also means doing regular breast self-exams. Let your health care provider know about any changes, no matter how small.  If you are in your 20s or 30s, you should have a clinical breast exam (CBE) by a health care provider every 1-3 years as part  of a regular health exam.  If you are 47 or older, have a CBE every year. Also consider having a breast X-ray (mammogram) every year.  If you have a family history of breast cancer, talk to your health care provider about genetic screening.  If you are at high risk for breast cancer, talk to your health care provider about having an MRI and a mammogram every year.  Breast cancer gene (BRCA) assessment is recommended for women who have family members with BRCA-related cancers. BRCA-related cancers  include: ? Breast. ? Ovarian. ? Tubal. ? Peritoneal cancers.  Results of the assessment will determine the need for genetic counseling and BRCA1 and BRCA2 testing. Cervical Cancer Your health care provider may recommend that you be screened regularly for cancer of the pelvic organs (ovaries, uterus, and vagina). This screening involves a pelvic examination, including checking for microscopic changes to the surface of your cervix (Pap test). You may be encouraged to have this screening done every 3 years, beginning at age 68.  For women ages 50-65, health care providers may recommend pelvic exams and Pap testing every 3 years, or they may recommend the Pap and pelvic exam, combined with testing for human papilloma virus (HPV), every 5 years. Some types of HPV increase your risk of cervical cancer. Testing for HPV may also be done on women of any age with unclear Pap test results.  Other health care providers may not recommend any screening for nonpregnant women who are considered low risk for pelvic cancer and who do not have symptoms. Ask your health care provider if a screening pelvic exam is right for you.  If you have had past treatment for cervical cancer or a condition that could lead to cancer, you need Pap tests and screening for cancer for at least 20 years after your treatment. If Pap tests have been discontinued, your risk factors (such as having a new sexual partner) need to be reassessed to determine if screening should resume. Some women have medical problems that increase the chance of getting cervical cancer. In these cases, your health care provider may recommend more frequent screening and Pap tests. Colorectal Cancer  This type of cancer can be detected and often prevented.  Routine colorectal cancer screening usually begins at 50 years of age and continues through 50 years of age.  Your health care provider may recommend screening at an earlier age if you have risk factors for  colon cancer.  Your health care provider may also recommend using home test kits to check for hidden blood in the stool.  A small camera at the end of a tube can be used to examine your colon directly (sigmoidoscopy or colonoscopy). This is done to check for the earliest forms of colorectal cancer.  Routine screening usually begins at age 70.  Direct examination of the colon should be repeated every 5-10 years through 50 years of age. However, you may need to be screened more often if early forms of precancerous polyps or small growths are found. Skin Cancer  Check your skin from head to toe regularly.  Tell your health care provider about any new moles or changes in moles, especially if there is a change in a mole's shape or color.  Also tell your health care provider if you have a mole that is larger than the size of a pencil eraser.  Always use sunscreen. Apply sunscreen liberally and repeatedly throughout the day.  Protect yourself by wearing long sleeves, pants, a  wide-brimmed hat, and sunglasses whenever you are outside. Heart disease, diabetes, and high blood pressure  High blood pressure causes heart disease and increases the risk of stroke. High blood pressure is more likely to develop in: ? People who have blood pressure in the high end of the normal range (130-139/85-89 mm Hg). ? People who are overweight or obese. ? People who are African American.  If you are 59-5 years of age, have your blood pressure checked every 3-5 years. If you are 46 years of age or older, have your blood pressure checked every year. You should have your blood pressure measured twice-once when you are at a hospital or clinic, and once when you are not at a hospital or clinic. Record the average of the two measurements. To check your blood pressure when you are not at a hospital or clinic, you can use: ? An automated blood pressure machine at a pharmacy. ? A home blood pressure monitor.  If you are  between 13 years and 19 years old, ask your health care provider if you should take aspirin to prevent strokes.  Have regular diabetes screenings. This involves taking a blood sample to check your fasting blood sugar level. ? If you are at a normal weight and have a low risk for diabetes, have this test once every three years after 50 years of age. ? If you are overweight and have a high risk for diabetes, consider being tested at a younger age or more often. Preventing infection Hepatitis B  If you have a higher risk for hepatitis B, you should be screened for this virus. You are considered at high risk for hepatitis B if: ? You were born in a country where hepatitis B is common. Ask your health care provider which countries are considered high risk. ? Your parents were born in a high-risk country, and you have not been immunized against hepatitis B (hepatitis B vaccine). ? You have HIV or AIDS. ? You use needles to inject street drugs. ? You live with someone who has hepatitis B. ? You have had sex with someone who has hepatitis B. ? You get hemodialysis treatment. ? You take certain medicines for conditions, including cancer, organ transplantation, and autoimmune conditions. Hepatitis C  Blood testing is recommended for: ? Everyone born from [birth year] through 1967. ? Anyone with known risk factors for hepatitis C. Sexually transmitted infections (STIs)  You should be screened for sexually transmitted infections (STIs) including gonorrhea and chlamydia if: ? You are sexually active and are younger than 50 years of age. ? You are older than 50 years of age and your health care provider tells you that you are at risk for this type of infection. ? Your sexual activity has changed since you were last screened and you are at an increased risk for chlamydia or gonorrhea. Ask your health care provider if you are at risk.  If you do not have HIV, but are at risk, it may be recommended that you take  a prescription medicine daily to prevent HIV infection. This is called pre-exposure prophylaxis (PrEP). You are considered at risk if: ? You are sexually active and do not regularly use condoms or know the HIV status of your partner(s). ? You take drugs by injection. ? You are sexually active with a partner who has HIV. Talk with your health care provider about whether you are at high risk of being infected with HIV. If you choose to begin PrEP, you should  first be tested for HIV. You should then be tested every 3 months for as long as you are taking PrEP. Pregnancy  If you are premenopausal and you may become pregnant, ask your health care provider about preconception counseling.  If you may become pregnant, take 400 to 800 micrograms (mcg) of folic acid every day.  If you want to prevent pregnancy, talk to your health care provider about birth control (contraception). Osteoporosis and menopause  Osteoporosis is a disease in which the bones lose minerals and strength with aging. This can result in serious bone fractures. Your risk for osteoporosis can be identified using a bone density scan.  If you are 41 years of age or older, or if you are at risk for osteoporosis and fractures, ask your health care provider if you should be screened.  Ask your health care provider whether you should take a calcium or vitamin D supplement to lower your risk for osteoporosis.  Menopause may have certain physical symptoms and risks.  Hormone replacement therapy may reduce some of these symptoms and risks. Talk to your health care provider about whether hormone replacement therapy is right for you. Follow these instructions at home:  Schedule regular health, dental, and eye exams.  Stay current with your immunizations.  Do not use any tobacco products including cigarettes, chewing tobacco, or electronic cigarettes.  If you are pregnant, do not drink alcohol.  If you are breastfeeding, limit how  much and how often you drink alcohol.  Limit alcohol intake to no more than 1 drink per day for nonpregnant women. One drink equals 12 ounces of beer, 5 ounces of wine, or 1 ounces of hard liquor.  Do not use street drugs.  Do not share needles.  Ask your health care provider for help if you need support or information about quitting drugs.  Tell your health care provider if you often feel depressed.  Tell your health care provider if you have ever been abused or do not feel safe at home. This information is not intended to replace advice given to you by your health care provider. Make sure you discuss any questions you have with your health care provider. Document Released: 09/11/2010 Document Revised: 08/04/2015 Document Reviewed: 11/30/2014 Elsevier Interactive Patient Education  2019 [employer].

## 2018-08-12 NOTE — Progress Notes (Signed)
Patient ID: Lindsay Burns, female  DOB: [birth year], 48 y.o.   MRN: [id number] Patient Care Team    Relationship Specialty Notifications Start End  Ma Hillock, DO PCP - General Family Medicine  11/25/14     Chief Complaint  Patient presents with  . Annual Exam    Mammogram- 02/28/2018, Pap smear 08/10/2015. Pt would like to have blood work completed due to dark circles under the eyes and appetite has changed. Pt would like to start back on  med for ADD     Subjective:  Lindsay Burns is a 48 y.o.  Female  present for CPE . All past medical history, surgical history, allergies, family history, immunizations, medications and social history were updated in the electronic medical record today. All recent labs, ED visits and hospitalizations within the last year were reviewed.  ADD:  Patient states she has not used the  better  Strattera for some time. Initially she was prescribed Strattera 40 mg BID (prior PCP) and it was too much. We decreased her dose to Strattera  25 mg BID- and she forgets to take second dose.  She states she does feel a positive effect after taking the medication. She denies any negative side effects. She is in need of refills today. Fatigue/vitamin B-12 deficiency/vitamin D deficiency: Patient reports having more fatigue than usual over the last few months.  She has also gained some weight.  She does not feel like her diet has changed all that much.  SHe is uncertain if her fatigue is related to the fact that she has not been on her Strattera for a few months.  She denies any unintentional weight loss, night sweats, rash, fever or increase in anxiety or depression.  Health maintenance:  Colonoscopy: No family or personal history of colon cancer, colon polyps or irritable bowel disease. Screen at 50. >>ordered placed today for referral.  Mammogram: 02/2018; 1 year f/u.  Family history maternal aunt of breast cancer. Order placed today.  Cervical cancer  screening: 2017 NL/HEg HPV, PCP- 5 year f/u. Immunizations: Flu vaccination (encourgaed yearly) and tetanus UTD 2016. Shingrix after 01/18/2019- educated on and ok for nurse visit after Bday.  Infectious disease screening: Completed in 2014, negative DEXA: Not indicated Assistive device: none Oxygen [id number] Patient has a Dental home. Hospitalizations/ED visits: reviewed  Depression screen [facility] 2/9 08/12/2018 01/28/2018 08/09/2015 11/25/2014  Decreased Interest 0 0 0 0  Down, Depressed, Hopeless 0 0 0 0  PHQ - 2 Score 0 0 0 0   No flowsheet data found.  Immunization History  Administered Date(s) Administered  . Tdap 11/25/2014    Past Medical History:  Diagnosis Date  . ADD (attention deficit disorder)    dx: 2006  . B12 deficiency   . Basal cell carcinoma    left side of face  . Sleep disorder    No Known Allergies Past Surgical History:  Procedure Laterality Date  . BASAL CELL CARCINOMA EXCISION Left    left face  . BREAST ENHANCEMENT SURGERY Bilateral    1999  . DILATION AND CURETTAGE OF UTERUS  2004  . TEMPOROMANDIBULAR JOINT SURGERY Bilateral 1987   Family History  Problem Relation Age of Onset  . Alcohol abuse Mother        was in [place]  . Breast cancer Mother 54  . Depression Mother   . Alcohol abuse Father        was in [place]  . Lung cancer Father   .  Brain cancer Father   . Breast cancer Paternal Grandmother    Social History   Social History Narrative   G3P2. Patient is married. Her husband works at [employer]. 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 She lives with her husband, son and daughter.   - She feels safe in her relationship, she wears her seatbelt, she wears a bike helmet, there are smoke alarms in her home. There are no firearms in her home.   - She drinks alcohol occasionally, she drinks caffeinated beverages, she does not take vitamins or herbal remedies.   - She exercises regularly.    Allergies as of  08/12/2018   No Known Allergies     Medication List       Accurate as of August 12, 2018  9:43 AM. If you have any questions, ask your nurse or doctor.        STOP taking these medications   atomoxetine 25 MG capsule Commonly known as:  Strattera Stopped by:  Howard Pouch, DO     TAKE these medications   fluticasone 50 MCG/ACT nasal spray Commonly known as:  FLONASE Place 2 sprays into both nostrils daily.   loratadine 10 MG tablet Commonly known as:  CLARITIN Take 10 mg by mouth daily.       All past medical history, surgical history, allergies, family history, immunizations andmedications were updated in the EMR today and reviewed under the history and medication portions of their EMR.     No results found for this or any previous visit (from the past 2160 hour(s)).  US Breast Ltd Uni Right Inc Axilla Result Date: 02/14/2018  IMPRESSION: Right breast cyst.  No evidence of malignancy in the right breast. RECOMMENDATION: Bilateral screening mammogram in 1 year is recommended. I have discussed the findings and recommendations with the patient. Results were also provided in writing at the conclusion of the visit. If applicable, a reminder letter will be sent to the patient regarding the next appointment. BI-RADS CATEGORY  2: Benign. Electronically Signed   By: Lillia Mountain M.D.   On: 02/14/2018 14:54   Mm Diag Breast W/implant Tomo Uni R Result Date: 02/14/2018  IMPRESSION: Right breast cyst.  No evidence of malignancy in the right breast. RECOMMENDATION: Bilateral screening mammogram in 1 year is recommended. I have discussed the findings and recommendations with the patient. Results were also provided in writing at the conclusion of the visit. If applicable, a reminder letter will be sent to the patient regarding the next appointment. BI-RADS CATEGORY  2: Benign. Electronically Signed   By: Lillia Mountain M.D.   On: 02/14/2018 14:54     ROS: 14 pt review of systems performed and  negative (unless mentioned in an HPI)  Objective: BP 103/69 (BP Location: Right Arm, Patient Position: Sitting, Cuff Size: Normal)   Pulse 68   Temp 97.9 F (36.6 C) (Temporal)   Resp 18   Ht 5' 6.75" (1.695 m)   Wt 137 lb 6 oz (62.3 kg)   LMP 07/22/2018   SpO2 100%   BMI 21.68 kg/m  Gen: Afebrile. No acute distress. Nontoxic in appearance, well-developed, well-nourished, pleasant, Caucasian female. HENT: AT. [place]. Bilateral TM visualized and normal in appearance, normal external auditory canal. MMM, no oral lesions, adequate dentition. Bilateral nares within normal limits. Throat without erythema, ulcerations or exudates.  No cough on exam, no hoarseness on exam. Eyes:Pupils Equal Round Reactive to light, Extraocular movements intact,  Conjunctiva without redness, discharge or icterus. Neck/lymp/endocrine: Supple, no lymphadenopathy, no thyromegaly CV: RRR no murmur, no edema, +2/4 P posterior tibialis pulses.  No carotid bruits. No JVD. Chest: CTAB, no wheeze, rhonchi or crackles.  Normal respiratory effort.  Good air movement. Abd: Soft.  Flat. NTND. BS present.  No masses palpated. No hepatosplenomegaly. No rebound tenderness or guarding. Skin: No rashes, purpura or petechiae. Warm and well-perfused. Skin intact. Neuro/Msk: Normal gait. PERLA. EOMi. Alert. Oriented x3.  Cranial nerves II through XII intact. Muscle strength 5/5 upper/lower extremity. DTRs equal bilaterally. Psych: Normal affect, dress and demeanor. Normal speech. Normal thought content and judgment.   No exam data present  Assessment/plan: Lindsay Burns is a 48 y.o. female present for CPE. Encounter for preventive health examination Patient was encouraged to exercise greater than 150 minutes a week. Patient was encouraged to choose a diet filled with fresh fruits and vegetables, and lean meats. AVS provided to patient today for education/recommendation on gender specific health and safety maintenance.  Colonoscopy: No family or personal history of colon cancer, colon polyps or irritable bowel disease. Screen at 50. >>ordered placed today for referral.  Mammogram: 02/2018; 1 year f/u.  Family history maternal aunt of breast cancer. Order placed today.  Cervical cancer screening: 2017 NL/HEg HPV, PCP- 5 year f/u. Immunizations: Flu vaccination (encourgaed yearly) and tetanus UTD 2016. Shingrix after 01/18/2019- educated on and ok for nurse visit after Bday.  Infectious disease screening: Completed in 2014, negative DEXA: Not indicated Lipid screening - Lipid panel Encounter for long-term current use of medication - Comprehensive metabolic panel Diabetes mellitus screening - Hemoglobin A1c Screening for deficiency anemia - CBC Colon cancer screening After 01/18/2019 - Ambulatory referral to Gastroenterology Breast cancer screening - MM 3D SCREEN BREAST BILATERAL; Future Attention deficit disorder (ADD) without hyperactivity Would like to get back on her Strattera.  Attempting to avoid controlled substances. -Strattera 40 mg daily -Follow-up in 6 months if needing refills. B12 deficiency/Sleep disorder/Other fatigue Patient reports feeling more fatigued today.  As well as gaining some weight.  Not sleeping as well. - B12 - TSH - Vitamin D (25 hydroxy)  Preventive CPE and > 15 minutes spent with patient on new and chronic conditions, >50% of time spent face to face counseling    Return in about 1 year (around 08/12/2019) for CPE (30 min). 6 mos [facility]  Electronically signed by: Howard Pouch, DO [place]

## 2018-08-12 NOTE — Telephone Encounter (Signed)
Please inform patient the following information: Her labs are all normal except her Vit d is rather  Low at 18 (normal 30-50).  I have prescribed the one a WEEK supplement for 12 weeks- to be taken with food. Pick a day of the week and stay with it.  Also add an extra vit d 1000 u to her daily regimen with her multivitamin as soon as prescribed supplement is completed to maintain levels.  Low vit d  Will certainly cause fatigue.

## 2018-08-13 NOTE — Telephone Encounter (Signed)
Pt was called and lab results were given to patient. Pt requested copy of labs be mailed to her. Labs mailed. Pt verbalized understanding.

## 2018-12-16 ENCOUNTER — Encounter: Payer: Self-pay | Admitting: Gastroenterology

## 2019-01-14 ENCOUNTER — Encounter: Payer: Self-pay | Admitting: Gastroenterology

## 2019-01-14 ENCOUNTER — Other Ambulatory Visit: Payer: Self-pay

## 2019-01-14 ENCOUNTER — Ambulatory Visit (AMBULATORY_SURGERY_CENTER): Payer: BC Managed Care – PPO | Admitting: *Deleted

## 2019-01-14 VITALS — Temp 96.9°F | Ht 66.75 in | Wt 136.8 lb

## 2019-01-14 DIAGNOSIS — Z1211 Encounter for screening for malignant neoplasm of colon: Secondary | ICD-10-CM

## 2019-01-14 DIAGNOSIS — Z1159 Encounter for screening for other viral diseases: Secondary | ICD-10-CM

## 2019-01-14 MED ORDER — NA SULFATE-K SULFATE-MG SULF 17.5-3.13-1.6 GM/177ML PO SOLN: 1.0000 | Freq: Once | ORAL | 0 refills | Status: AC

## 2019-01-14 NOTE — Progress Notes (Signed)
No egg or soy allergy known to patient  No issues with past sedation with any surgeries  or procedures, no intubation problems  No diet pills per patient No home 02 use per patient  No blood thinners per patient  Pt denies issues with constipation  No A fib or A flutter  EMMI video sent to pt's e mail   Due to the COVID-19 pandemic we are asking patients to follow these guidelines. Please only bring one care partner. Please be aware that your care partner may wait in the car in the parking lot or if they feel like they will be too hot to wait in the car, they may wait in the lobby on the 4th floor. All care partners are required to wear a mask the entire time (we do not have any that we can provide them), they need to practice social distancing, and we will do a Covid check for all patient's and care partners when you arrive. Also we will check their temperature and your temperature. If the care partner waits in their car they need to stay in the parking lot the entire time and we will call them on their cell phone when the patient is ready for discharge so they can bring the car to the front of the building. Also all patient's will need to wear a mask into building.   Coupon for suprep provided.  covid screening 01/23/19, 8:05 am

## 2019-01-19 ENCOUNTER — Other Ambulatory Visit: Payer: Self-pay | Admitting: Family Medicine

## 2019-01-19 NOTE — Telephone Encounter (Signed)
Pt was called and has enough medications to last until the end of next week. She would like to wait until after her colonoscopy next week to come in. Pt was scheduled and was advised that no refills could be sent in until appt, she verbalizes understanding

## 2019-01-19 NOTE — Telephone Encounter (Signed)
Please call patient. I have received refill request for pts Strattera.  If needing refills she will need a face-to-face appointment.  This is a controlled substance.

## 2019-01-28 ENCOUNTER — Encounter: Payer: BLUE CROSS/BLUE SHIELD | Admitting: Gastroenterology

## 2019-01-30 ENCOUNTER — Telehealth: Payer: Self-pay | Admitting: Family Medicine

## 2019-01-30 ENCOUNTER — Other Ambulatory Visit: Payer: Self-pay

## 2019-01-30 ENCOUNTER — Encounter: Payer: Self-pay | Admitting: Family Medicine

## 2019-01-30 ENCOUNTER — Ambulatory Visit: Payer: BC Managed Care – PPO | Admitting: Family Medicine

## 2019-01-30 VITALS — BP 108/56 | HR 78 | Temp 98.1°F | Resp 18 | Ht 67.0 in | Wt 134.5 lb

## 2019-01-30 DIAGNOSIS — E559 Vitamin D deficiency, unspecified: Secondary | ICD-10-CM | POA: Diagnosis not present

## 2019-01-30 DIAGNOSIS — F988 Other specified behavioral and emotional disorders with onset usually occurring in childhood and adolescence: Secondary | ICD-10-CM | POA: Diagnosis not present

## 2019-01-30 LAB — VITAMIN D 25 HYDROXY (VIT D DEFICIENCY, FRACTURES): VITD: 19.89 ng/mL — ABNORMAL LOW (ref 30.00–100.00)

## 2019-01-30 MED ORDER — VITAMIN D (ERGOCALCIFEROL) 1.25 MG (50000 UNIT) PO CAPS: 50000.0000 [IU] | ORAL_CAPSULE | ORAL | 0 refills | Status: DC

## 2019-01-30 MED ORDER — AMPHETAMINE-DEXTROAMPHETAMINE 5 MG PO TABS: 5.0000 mg | ORAL_TABLET | Freq: Two times a day (BID) | ORAL | 0 refills | Status: DC

## 2019-01-30 MED ORDER — ATOMOXETINE HCL 40 MG PO CAPS: 40.0000 mg | ORAL_CAPSULE | Freq: Every day | ORAL | 1 refills | Status: DC

## 2019-01-30 NOTE — Progress Notes (Signed)
Lindsay Burns , [birth year], 48 y.o., female MRN: [id number] Patient Care Team    Relationship Specialty Notifications Start End  Ma Hillock, DO PCP - General Family Medicine  11/25/14     Chief Complaint  Patient presents with  . ADD    Pt would like to add a small dose of a Stimulant to her daily regiment.     Subjective:  ADD (attention deficit disorder) She has been on Strattera. She has been remembering  to take daily now. . She has continued to forget afternoon doses at times. She denies any unintentional weight loss. She has been working out 5x a week. She denies any side effects. She states she is being more forgetful, low motivation, more silly mistakes. She is wondering if adding back a small dose of stimulant would be helpful? She had been on vyvanse and adderall in the past.   Vit D: Patient is asking if she can have her vitamin D checked again.  She has a history of low vitamin D.  She was supplied with 12 weeks of high-dose that ended few months ago.  She did not start an over-the-counter supplementation.  No Known Allergies Social History   Tobacco Use  . Smoking status: Never Smoker  . Smokeless tobacco: Never Used  Substance Use Topics  . Alcohol use: Yes    Alcohol/week: 1.0 standard drinks    Types: 1 Standard drinks or equivalent per week    Comment: most fridays will have 1 drink    Past Medical History:  Diagnosis Date  . ADD (attention deficit disorder)    dx: 2006  . Allergy    seasonal  . B12 deficiency   . Basal cell carcinoma    left side of face  . Sleep disorder    Past Surgical History:  Procedure Laterality Date  . BASAL CELL CARCINOMA EXCISION Left    left face  . BREAST ENHANCEMENT SURGERY Bilateral    1999  . DILATION AND CURETTAGE OF UTERUS  2004  . TEMPOROMANDIBULAR JOINT SURGERY Bilateral 1987   Family History  Problem Relation Age of Onset  . Alcohol abuse Mother        was in [place]  . Breast cancer Mother 66  .  Depression Mother   . Esophageal cancer Mother   . Alcohol abuse Father        was in [place]  . Lung cancer Father   . Brain cancer Father   . Breast cancer Paternal Grandmother   . Colon cancer Paternal Uncle   . Colon polyps Neg Hx   . Rectal cancer Neg Hx   . Stomach cancer Neg Hx    Allergies as of 01/30/2019   No Known Allergies     Medication List       Accurate as of January 30, 2019 10:11 AM. If you have any questions, ask your nurse or doctor.        STOP taking these medications   Vitamin D (Ergocalciferol) 1.25 MG (50000 UT) Caps capsule Commonly known as: DRISDOL Stopped by: Howard Pouch, DO     TAKE these medications   atomoxetine 40 MG capsule Commonly known as: Strattera Take 1 capsule (40 mg total) by mouth daily.   fluticasone 50 MCG/ACT nasal spray Commonly known as: FLONASE Place 2 sprays into both nostrils daily.   loratadine 10 MG tablet Commonly known as: CLARITIN Take 10 mg by mouth daily.   MULTIVITAMIN PO Take by mouth.  No results found for this or any previous visit (from the past 24 hour(s)). No results found.   ROS: Negative, with the exception of above mentioned in HPI   Objective:  BP (!) 108/56 (BP Location: Left Arm, Patient Position: Sitting, Cuff Size: Normal)   Pulse 78   Temp 98.1 F (36.7 C) (Temporal)   Resp 18   Ht 5\' 7"  (1.702 m)   Wt 134 lb 8 oz (61 kg)   LMP 07/22/2018   SpO2 100%   BMI 21.07 kg/m  Body mass index is 21.07 kg/m. Gen: Afebrile. No acute distress.  HENT: AT. [place]. Eyes:Pupils Equal Round Reactive to light, Extraocular movements intact,  Conjunctiva without redness, discharge or icterus. Neuro: Normal gait. PERLA. EOMi. Alert. Oriented. Psych: Normal affect, dress and demeanor. Normal speech. Normal thought content and judgment.   Assessment/Plan: Lindsay Burns is a 48 y.o. female present for acute OV for  Attention deficit disorder (ADD) without hyperactivity -Not as well  controlled. - Lengthy discussion today surrounding adding Adderall 5 mg twice daily.  She is agreeable to this today. - [place] reviewed.  - uds next appt and contract if continues medication.  - continue Strattera 40 mg QD - f/u 3 mos face to face.   Vit d: Vitamin D levels will be checked again today.  Patient will likely need high-dose vitamin D again.  She also be guided on over-the-counter supplement to start.    > 25 minutes spent with patient, >50% of time spent face to face       This visit occurred during the SARS-CoV-2 public health emergency.  Safety protocols were in place, including screening questions prior to the visit, additional usage of staff PPE, and extensive cleaning of exam room while observing appropriate contact time as indicated for disinfecting solutions.    electronically signed by:  Howard Pouch, DO  [place]

## 2019-01-30 NOTE — Telephone Encounter (Signed)
Please inform patient the following information: Her vit d is 63, very low. I have called in the high dose once a week again for 12 weeks. Take with food.  Also take OTC vit d 1000u a day (except day taking the high dose). The OTC needs to be continued even after high dose completed.  We will check again at her appt in 3 months.

## 2019-01-30 NOTE — Patient Instructions (Addendum)
Great to see you today.  Stay safe.  Have a great holiday.   Continue Strattera and start adderall in the morning and if need you can take a second dose by 1 pm.   Follow up in 3 mos.

## 2019-02-02 NOTE — Telephone Encounter (Signed)
Pt was called and VM was left to return call  °

## 2019-02-02 NOTE — Telephone Encounter (Signed)
LM for pt to returncall

## 2019-02-03 MED ORDER — VITAMIN D (ERGOCALCIFEROL) 1.25 MG (50000 UNIT) PO CAPS: 50000.0000 [IU] | ORAL_CAPSULE | ORAL | 0 refills | Status: DC

## 2019-02-03 NOTE — Telephone Encounter (Signed)
Pt was called and would like the once weekly [facility]

## 2019-02-03 NOTE — Addendum Note (Signed)
Addended by: Caroll Rancher L on: 02/03/2019 02:33 PM   Modules accepted: Orders

## 2019-02-03 NOTE — Telephone Encounter (Signed)
Patient needs Rx for vit d for 90 days to be sent to [facility]. Thank you

## 2019-02-03 NOTE — Telephone Encounter (Signed)
Pt was called and given results, she verbalized understanding

## 2019-02-13 DIAGNOSIS — Z03818 Encounter for observation for suspected exposure to other biological agents ruled out: Secondary | ICD-10-CM | POA: Diagnosis not present

## 2019-04-06 ENCOUNTER — Other Ambulatory Visit: Payer: Self-pay | Admitting: Family Medicine

## 2019-08-02 ENCOUNTER — Other Ambulatory Visit: Payer: Self-pay | Admitting: Family Medicine

## 2019-09-03 ENCOUNTER — Encounter: Payer: Self-pay | Admitting: Family Medicine

## 2019-09-03 ENCOUNTER — Ambulatory Visit: Payer: BC Managed Care – PPO | Admitting: Family Medicine

## 2019-09-03 ENCOUNTER — Encounter: Payer: Self-pay | Admitting: Gastroenterology

## 2019-09-03 ENCOUNTER — Other Ambulatory Visit: Payer: Self-pay

## 2019-09-03 VITALS — BP 100/67 | HR 89 | Temp 97.7°F | Resp 17 | Ht 67.0 in | Wt 137.1 lb

## 2019-09-03 DIAGNOSIS — F988 Other specified behavioral and emotional disorders with onset usually occurring in childhood and adolescence: Secondary | ICD-10-CM

## 2019-09-03 DIAGNOSIS — E559 Vitamin D deficiency, unspecified: Secondary | ICD-10-CM | POA: Diagnosis not present

## 2019-09-03 DIAGNOSIS — Z1211 Encounter for screening for malignant neoplasm of colon: Secondary | ICD-10-CM

## 2019-09-03 MED ORDER — ATOMOXETINE HCL 40 MG PO CAPS: 40.0000 mg | ORAL_CAPSULE | Freq: Every day | ORAL | 1 refills | Status: DC

## 2019-09-03 MED ORDER — AMPHETAMINE-DEXTROAMPHETAMINE 5 MG PO TABS: 5.0000 mg | ORAL_TABLET | Freq: Two times a day (BID) | ORAL | 0 refills | Status: DC

## 2019-09-03 MED ORDER — TERBINAFINE HCL 250 MG PO TABS: 250.0000 mg | ORAL_TABLET | Freq: Every day | ORAL | 0 refills | Status: DC

## 2019-09-03 NOTE — Progress Notes (Signed)
Lindsay Burns , February 26, 1971, 49 y.o., female MRN: [id number] Patient Care Team    Relationship Specialty Notifications Start End  Ma Hillock, DO PCP - General Family Medicine  11/25/14     Chief Complaint  Patient presents with  . ADD    Needs refills     Subjective:  ADD (attention deficit disorder) She has been on Strattera for many years.  She denies any unintentional weight loss. She has been working out 5x a week. She denies any side effects.Last appt She stated she was being more forgetful, low motivation, more silly mistakes. Low dose adderal was added to her regimen. Today she states the medication combo worked well for her.   Vit D: She has a history of low vitamin D.  She was supplied with 12 weeks of high-dose twice over the last year. She had been instructed to also start OTC vit d .   Toenail fungus:  She would like to consider trying medication for toenail fungus treatment again. Terbinafine caused her constipation last time she tried.   No Known Allergies Social History   Tobacco Use  . Smoking status: Never Smoker  . Smokeless tobacco: Never Used  Substance Use Topics  . Alcohol use: Yes    Alcohol/week: 1.0 standard drink    Types: 1 Standard drinks or equivalent per week    Comment: most fridays will have 1 drink    Past Medical History:  Diagnosis Date  . ADD (attention deficit disorder)    dx: 2006  . Allergy    seasonal  . B12 deficiency   . Basal cell carcinoma    left side of face  . Sleep disorder    Past Surgical History:  Procedure Laterality Date  . BASAL CELL CARCINOMA EXCISION Left    left face  . BREAST ENHANCEMENT SURGERY Bilateral    1999  . DILATION AND CURETTAGE OF UTERUS  2004  . TEMPOROMANDIBULAR JOINT SURGERY Bilateral 1987   Family History  Problem Relation Age of Onset  . Alcohol abuse Mother        was in [place]  . Breast cancer Mother 27  . Depression Mother   . Esophageal cancer Mother   . Alcohol abuse  Father        was in [place]  . Lung cancer Father   . Brain cancer Father   . Breast cancer Paternal Grandmother   . Colon cancer Paternal Uncle   . Colon polyps Neg Hx   . Rectal cancer Neg Hx   . Stomach cancer Neg Hx    Allergies as of 09/03/2019   No Known Allergies     Medication List       Accurate as of September 03, 2019  8:06 AM. If you have any questions, ask your nurse or doctor.        amphetamine-dextroamphetamine 5 MG tablet Commonly known as: Adderall Take 1 tablet (5 mg total) by mouth 2 (two) times daily with a meal.   atomoxetine 40 MG capsule Commonly known as: Strattera Take 1 capsule (40 mg total) by mouth daily.   fluticasone 50 MCG/ACT nasal spray Commonly known as: FLONASE Place 2 sprays into both nostrils daily.   loratadine 10 MG tablet Commonly known as: CLARITIN Take 10 mg by mouth daily.   MULTIVITAMIN PO Take by mouth.   Vitamin D (Ergocalciferol) 1.25 MG (50000 UNIT) Caps capsule Commonly known as: DRISDOL Take 1 capsule (50,000 Units total) by mouth every  7 (seven) days.       No results found for this or any previous visit (from the past 24 hour(s)). No results found.   ROS: Negative, with the exception of above mentioned in HPI   Objective:  BP 100/67 (BP Location: Left Arm, Patient Position: Sitting, Cuff Size: Normal)   Pulse 89   Temp 97.7 F (36.5 C) (Temporal)   Resp 17   Ht 5\' 7"  (1.702 m)   Wt 137 lb 2 oz (62.2 kg)   LMP 08/13/2019 (Exact Date)   SpO2 99%   Breastfeeding Unknown   BMI 21.48 kg/m  Body mass index is 21.48 kg/m. Gen: Afebrile. No acute distress.  HENT: AT. [place].  Eyes:Pupils Equal Round Reactive to light, Extraocular movements intact,  Conjunctiva without redness, discharge or icterus. CV: RRR  Chest: CTAB, no wheeze or crackles Skin: no rashes, purpura or petechiae. Thickened discolored toenails bilateral feet.  Neuro:  Normal gait. PERLA. EOMi. Alert. Oriented x3 Psych: Normal affect, dress and  demeanor. Normal speech. Normal thought content and judgment.   Assessment/Plan: Lindsay Burns is a 49 y.o. female present for acute OV for  Attention deficit disorder (ADD) without hyperactivity - does well when taking meds as prescribed.  - [facility] database reviewed 09/03/19 - uds next appt and contract  - continue  Strattera 40 mg QD - continue adderall 5 mg BID - f/u 6 mos face to face.   Vit d: She did complete the 12 weeks (3 months ago).  She did start MV with D- unknown dose.   Toenail fungus: - terbinafine 250 Qd x12 weeks prescribed. If she decides to start she is to have labs at 6 weeks. Order has been placed.  - to attempt to avoid constipation SE she reported last time she should take a daily stool softener such as colace.  - she could also consider podiatry opinion.   Referred back to GI for her colonoscopy, which was postponed 2/2 to COVID and insurance. Pt is now 74 and has not had colon cancer screening.     This visit occurred during the SARS-CoV-2 public health emergency.  Safety protocols were in place, including screening questions prior to the visit, additional usage of staff PPE, and extensive cleaning of exam room while observing appropriate contact time as indicated for disinfecting solutions.   Orders Placed This Encounter  Procedures  . Vitamin D (25 hydroxy)  . Ambulatory referral to Gastroenterology   Meds ordered this encounter  Medications  . atomoxetine (STRATTERA) 40 MG capsule    Sig: Take 1 capsule (40 mg total) by mouth daily.    Dispense:  90 capsule    Refill:  1  . amphetamine-dextroamphetamine (ADDERALL) 5 MG tablet    Sig: Take 1 tablet (5 mg total) by mouth 2 (two) times daily with a meal.    Dispense:  180 tablet    Refill:  0    electronically signed by:  Howard Pouch, DO  [place]

## 2019-09-03 NOTE — Patient Instructions (Addendum)
I have called in your medications.  [facility] and [facility] in pharmacy for [place].  I have calle din the mail fungus medication. It is 12 weeks of treatment. You will need to make a lab appt after 6 weeks to have labs while on this medication.    If you start nail treatment> you need to make a lab appt in 6 weeks for Liver labs.   Check the total dose of vit d you are taking and your covid vaccine dates and call us back with those or echart [place].

## 2019-09-04 ENCOUNTER — Telehealth: Payer: Self-pay | Admitting: Family Medicine

## 2019-09-04 LAB — VITAMIN D 25 HYDROXY (VIT D DEFICIENCY, FRACTURES): Vit D, 25-Hydroxy: 20 ng/mL — ABNORMAL LOW (ref 30–100)

## 2019-09-04 NOTE — Telephone Encounter (Signed)
Pt was called and given results, she verbalized understanding. COVID information added

## 2019-09-04 NOTE — Telephone Encounter (Signed)
Please call patient  Lindsay Burns.  Please ask for the dates of her Covid vaccines that she was to have these ready so we can add them to her chart  2.  Her vitamin D is insufficient at 20.  She will require 1000 units vitamin D supplement daily, in addition to her multivitamin, in order to keep her levels in normal range.  She has trouble remembering to take her vitamin D daily.  I would encourage her to set phone alarms, purchase a pillbox or place her vitamin D tabs next 2 or Strattera which she takes in the morning.  I do not believe she needs the high-dose once weekly, she just needs to get habit of taking the daily vitamin D.

## 2019-10-09 ENCOUNTER — Ambulatory Visit (AMBULATORY_SURGERY_CENTER): Payer: Self-pay | Admitting: *Deleted

## 2019-10-09 ENCOUNTER — Other Ambulatory Visit: Payer: Self-pay

## 2019-10-09 ENCOUNTER — Encounter: Payer: Self-pay | Admitting: Gastroenterology

## 2019-10-09 VITALS — Ht 67.0 in | Wt 138.0 lb

## 2019-10-09 DIAGNOSIS — Z1211 Encounter for screening for malignant neoplasm of colon: Secondary | ICD-10-CM

## 2019-10-09 MED ORDER — NA SULFATE-K SULFATE-MG SULF 17.5-3.13-1.6 GM/177ML PO SOLN: ORAL | 0 refills | Status: DC

## 2019-10-09 NOTE — Progress Notes (Signed)
Patient is here in-person for PV. Patient denies any allergies to eggs or soy. Patient denies any problems with anesthesia/sedation. Patient denies any oxygen use at home. Patient denies taking any diet/weight loss medications or blood thinners. Patient is not being treated for MRSA or C-diff. Patient is aware of our care-partner policy and MITVI-71 safety protocol. EMMI education assisgned to the patient for the procedure, this was explained and instructions given to patient.   COVID-19 vaccines completed 06/13/19 Prep Prescription coupon given to the patient.

## 2019-11-04 ENCOUNTER — Other Ambulatory Visit: Payer: Self-pay

## 2019-11-04 ENCOUNTER — Encounter: Payer: Self-pay | Admitting: Gastroenterology

## 2019-11-04 ENCOUNTER — Ambulatory Visit (AMBULATORY_SURGERY_CENTER): Payer: BC Managed Care – PPO | Admitting: Gastroenterology

## 2019-11-04 VITALS — BP 110/59 | HR 64 | Temp 97.8°F | Resp 14 | Ht 67.0 in | Wt 138.0 lb

## 2019-11-04 DIAGNOSIS — Z1211 Encounter for screening for malignant neoplasm of colon: Secondary | ICD-10-CM

## 2019-11-04 DIAGNOSIS — D120 Benign neoplasm of cecum: Secondary | ICD-10-CM

## 2019-11-04 DIAGNOSIS — D123 Benign neoplasm of transverse colon: Secondary | ICD-10-CM

## 2019-11-04 MED ORDER — SODIUM CHLORIDE 0.9 % IV SOLN: 500.0000 mL | Freq: Once | INTRAVENOUS | Status: DC

## 2019-11-04 NOTE — Patient Instructions (Signed)
Handouts given for polyps and hemorrhoids.  Await pathology results.  YOU HAD AN ENDOSCOPIC PROCEDURE TODAY AT [facility]:   Refer to the procedure report that was given to you for any specific questions about what was found during the examination.  If the procedure report does not answer your questions, please call your gastroenterologist to clarify.  If you requested that your care partner not be given the details of your procedure findings, then the procedure report has been included in a sealed envelope for you to review at your convenience later.  YOU SHOULD EXPECT: Some feelings of bloating in the abdomen. Passage of more gas than usual.  Walking can help get rid of the air that was put into your GI tract during the procedure and reduce the bloating. If you had a lower endoscopy (such as a colonoscopy or flexible sigmoidoscopy) you may notice spotting of blood in your stool or on the toilet paper. If you underwent a bowel prep for your procedure, you may not have a normal bowel movement for a few days.  Please Note:  You might notice some irritation and congestion in your nose or some drainage.  This is from the oxygen used during your procedure.  There is no need for concern and it should clear up in a day or so.  SYMPTOMS TO REPORT IMMEDIATELY:   Following lower endoscopy (colonoscopy or flexible sigmoidoscopy):  Excessive amounts of blood in the stool  Significant tenderness or worsening of abdominal pains  Swelling of the abdomen that is new, acute  Fever of 100F or higher  For urgent or emergent issues, a gastroenterologist can be reached at any hour by calling [phone]. Do not use MyChart messaging for urgent concerns.    DIET:  We do recommend a small meal at first, but then you may proceed to your regular diet.  Drink plenty of fluids but you should avoid alcoholic beverages for 24 hours.  ACTIVITY:  You should plan to take it easy for the rest of today  and you should NOT DRIVE or use heavy machinery until tomorrow (because of the sedation medicines used during the test).    FOLLOW UP: Our staff will call the number listed on your records 48-72 hours following your procedure to check on you and address any questions or concerns that you may have regarding the information given to you following your procedure. If we do not reach you, we will leave a message.  We will attempt to reach you two times.  During this call, we will ask if you have developed any symptoms of COVID 19. If you develop any symptoms (ie: fever, flu-like symptoms, shortness of breath, cough etc.) before then, please call [phone].  If you test positive for Covid 19 in the 2 weeks post procedure, please call and report this information to [place].    If any biopsies were taken you will be contacted by phone or by letter within the next 1-3 weeks.  Please call us at [phone] if you have not heard about the biopsies in 3 weeks.    SIGNATURES/CONFIDENTIALITY: You and/or your care partner have signed paperwork which will be entered into your electronic medical record.  These signatures attest to the fact that that the information above on your After Visit Summary has been reviewed and is understood.  Full responsibility of the confidentiality of this discharge information lies with you and/or your care-partner.

## 2019-11-04 NOTE — Progress Notes (Signed)
Pt's states no medical or surgical changes since previsit or office visit.  Pt says that Suprep was too expensive and she did the Miralax prep instead. She did 1 dose at 4pm and the second dose at 11pm last night. Results are clear to yellow liquid.  CW vitals and Aw IV.

## 2019-11-04 NOTE — Progress Notes (Signed)
pt tolerated well. VSS. awake and to recovery. Report given to RN.  

## 2019-11-04 NOTE — Op Note (Signed)
[facility] Patient Name: Lindsay Burns Procedure Date: 11/04/2019 7:57 AM MRN: [id number] Endoscopist: Remo Lipps P. Havery Moros , MD Age: 49 Referring MD:  Date of Birth: [birth year] Gender: Female Account #: [id number] Procedure:                Colonoscopy Indications:              Screening for colorectal malignant neoplasm, This                            is the patient's first colonoscopy Medicines:                Monitored Anesthesia Care Procedure:                Pre-Anesthesia Assessment:                           - Prior to the procedure, a History and Physical                            was performed, and patient medications and                            allergies were reviewed. The patient's tolerance of                            previous anesthesia was also reviewed. The risks                            and benefits of the procedure and the sedation                            options and risks were discussed with the patient.                            All questions were answered, and informed consent                            was obtained. Prior Anticoagulants: The patient has                            taken no previous anticoagulant or antiplatelet                            agents. ASA Grade Assessment: I - A normal, healthy                            patient. After reviewing the risks and benefits,                            the patient was deemed in satisfactory condition to                            undergo the procedure.  After obtaining informed consent, the colonoscope                            was passed under direct vision. Throughout the                            procedure, the patient's blood pressure, pulse, and                            oxygen saturations were monitored continuously. The                            Colonoscope was introduced through the anus and                            advanced to the the cecum,  identified by                            appendiceal orifice and ileocecal valve. The                            colonoscopy was performed without difficulty. The                            patient tolerated the procedure well. The quality                            of the bowel preparation was good. The ileocecal                            valve, appendiceal orifice, and rectum were                            photographed. Scope In: 8:05:02 AM Scope Out: 8:22:15 AM Scope Withdrawal Time: 0 hours 12 minutes 58 seconds  Total Procedure Duration: 0 hours 17 minutes 13 seconds  Findings:                 The perianal and digital rectal examinations were                            normal.                           A 3 mm polyp was found in the cecum. The polyp was                            flat. The polyp was removed with a cold snare.                            Resection and retrieval were complete.                           A 4 mm polyp was found in the transverse colon. The  polyp was flat. The polyp was removed with a cold                            snare. Resection and retrieval were complete.                           Internal hemorrhoids were found during retroflexion.                           The exam was otherwise without abnormality. Complications:            No immediate complications. Estimated blood loss:                            Minimal. Estimated Blood Loss:     Estimated blood loss was minimal. Impression:               - One 3 mm polyp in the cecum, removed with a cold                            snare. Resected and retrieved.                           - One 4 mm polyp in the transverse colon, removed                            with a cold snare. Resected and retrieved.                           - Internal hemorrhoids.                           - The examination was otherwise normal. Recommendation:           - Patient has a contact number  available for                            emergencies. The signs and symptoms of potential                            delayed complications were discussed with the                            patient. Return to normal activities tomorrow.                            Written discharge instructions were provided to the                            patient.                           - Resume previous diet.                           - Continue present medications.                           -  Await pathology results. Remo Lipps P. Havery Moros, MD 11/04/2019 8:29:21 AM This report has been signed electronically.

## 2019-11-06 ENCOUNTER — Telehealth: Payer: Self-pay

## 2019-11-06 NOTE — Telephone Encounter (Signed)
Left message on answering machine. 

## 2019-11-12 ENCOUNTER — Encounter: Payer: Self-pay | Admitting: Gastroenterology

## 2020-01-01 DIAGNOSIS — M25512 Pain in left shoulder: Secondary | ICD-10-CM | POA: Diagnosis not present

## 2020-01-01 DIAGNOSIS — M7502 Adhesive capsulitis of left shoulder: Secondary | ICD-10-CM | POA: Diagnosis not present

## 2020-01-03 DIAGNOSIS — M7502 Adhesive capsulitis of left shoulder: Secondary | ICD-10-CM | POA: Insufficient documentation

## 2020-02-12 ENCOUNTER — Other Ambulatory Visit: Payer: Self-pay | Admitting: Family Medicine

## 2020-02-23 DIAGNOSIS — M7502 Adhesive capsulitis of left shoulder: Secondary | ICD-10-CM | POA: Diagnosis not present

## 2020-03-18 ENCOUNTER — Telehealth: Payer: Self-pay | Admitting: Family Medicine

## 2020-03-18 ENCOUNTER — Encounter: Payer: BC Managed Care – PPO | Admitting: Family Medicine

## 2020-03-18 NOTE — Telephone Encounter (Signed)
Patient requesting refill of Strattera. Physical appt had to be rescheduled due to provider out of office. New appt will be 04/08/20. Patient needs refill enough to get through. Please send to same [facility] home delivery.

## 2020-03-18 NOTE — Telephone Encounter (Signed)
Please advise. Pt has new appt sched. Pt is okay with send a bridge to [place]

## 2020-03-21 MED ORDER — ATOMOXETINE HCL 40 MG PO CAPS
40.0000 mg | ORAL_CAPSULE | Freq: Every day | ORAL | 0 refills | Status: DC
Start: 2020-03-21 — End: 2020-04-08

## 2020-03-21 NOTE — Telephone Encounter (Signed)
LVM that rx has been sent to [facility]

## 2020-03-21 NOTE — Telephone Encounter (Signed)
[facility]

## 2020-04-08 ENCOUNTER — Other Ambulatory Visit: Payer: Self-pay

## 2020-04-08 ENCOUNTER — Encounter: Payer: Self-pay | Admitting: Family Medicine

## 2020-04-08 ENCOUNTER — Ambulatory Visit (INDEPENDENT_AMBULATORY_CARE_PROVIDER_SITE_OTHER): Payer: BC Managed Care – PPO | Admitting: Family Medicine

## 2020-04-08 VITALS — BP 107/70 | HR 91 | Temp 97.3°F | Ht 66.5 in | Wt 134.0 lb

## 2020-04-08 DIAGNOSIS — Z13220 Encounter for screening for lipoid disorders: Secondary | ICD-10-CM | POA: Diagnosis not present

## 2020-04-08 DIAGNOSIS — Z1231 Encounter for screening mammogram for malignant neoplasm of breast: Secondary | ICD-10-CM

## 2020-04-08 DIAGNOSIS — F988 Other specified behavioral and emotional disorders with onset usually occurring in childhood and adolescence: Secondary | ICD-10-CM | POA: Diagnosis not present

## 2020-04-08 DIAGNOSIS — Z130 Encounter for screening for diseases of the blood and blood-forming organs and certain disorders involving the immune mechanism: Secondary | ICD-10-CM

## 2020-04-08 DIAGNOSIS — Z0000 Encounter for general adult medical examination without abnormal findings: Secondary | ICD-10-CM

## 2020-04-08 DIAGNOSIS — G479 Sleep disorder, unspecified: Secondary | ICD-10-CM

## 2020-04-08 DIAGNOSIS — Z803 Family history of malignant neoplasm of breast: Secondary | ICD-10-CM

## 2020-04-08 DIAGNOSIS — E559 Vitamin D deficiency, unspecified: Secondary | ICD-10-CM

## 2020-04-08 DIAGNOSIS — E538 Deficiency of other specified B group vitamins: Secondary | ICD-10-CM | POA: Diagnosis not present

## 2020-04-08 DIAGNOSIS — Z23 Encounter for immunization: Secondary | ICD-10-CM

## 2020-04-08 DIAGNOSIS — Z131 Encounter for screening for diabetes mellitus: Secondary | ICD-10-CM

## 2020-04-08 DIAGNOSIS — Z79899 Other long term (current) drug therapy: Secondary | ICD-10-CM

## 2020-04-08 MED ORDER — AMPHETAMINE-DEXTROAMPHETAMINE 5 MG PO TABS: 5.0000 mg | ORAL_TABLET | Freq: Two times a day (BID) | ORAL | 0 refills | Status: DC

## 2020-04-08 MED ORDER — ATOMOXETINE HCL 40 MG PO CAPS: 40.0000 mg | ORAL_CAPSULE | Freq: Every day | ORAL | 1 refills | Status: DC

## 2020-04-08 NOTE — Patient Instructions (Signed)
Health Maintenance, Female Adopting a healthy lifestyle and getting preventive care are important in promoting health and wellness. Ask your health care provider about:  The right schedule for you to have regular tests and exams.  Things you can do on your own to prevent diseases and keep yourself healthy. What should I know about diet, weight, and exercise? Eat a healthy diet  Eat a diet that includes plenty of vegetables, fruits, low-fat dairy products, and lean protein.  Do not eat a lot of foods that are high in solid fats, added sugars, or sodium.   Maintain a healthy weight Body mass index (BMI) is used to identify weight problems. It estimates body fat based on height and weight. Your health care provider can help determine your BMI and help you achieve or maintain a healthy weight. Get regular exercise Get regular exercise. This is one of the most important things you can do for your health. Most adults should:  Exercise for at least 150 minutes each week. The exercise should increase your heart rate and make you sweat (moderate-intensity exercise).  Do strengthening exercises at least twice a week. This is in addition to the moderate-intensity exercise.  Spend less time sitting. Even light physical activity can be beneficial. Watch cholesterol and blood lipids Have your blood tested for lipids and cholesterol at 52 years of age, then have this test every 5 years. Have your cholesterol levels checked more often if:  Your lipid or cholesterol levels are high.  You are older than 52 years of age.  You are at high risk for heart disease. What should I know about cancer screening? Depending on your health history and family history, you may need to have cancer screening at various ages. This may include screening for:  Breast cancer.  Cervical cancer.  Colorectal cancer.  Skin cancer.  Lung cancer. What should I know about heart disease, diabetes, and high blood  pressure? Blood pressure and heart disease  High blood pressure causes heart disease and increases the risk of stroke. This is more likely to develop in people who have high blood pressure readings, are of African descent, or are overweight.  Have your blood pressure checked: ? Every 3-5 years if you are 18-39 years of age. ? Every year if you are 40 years old or older. Diabetes Have regular diabetes screenings. This checks your fasting blood sugar level. Have the screening done:  Once every three years after age 40 if you are at a normal weight and have a low risk for diabetes.  More often and at a younger age if you are overweight or have a high risk for diabetes. What should I know about preventing infection? Hepatitis B If you have a higher risk for hepatitis B, you should be screened for this virus. Talk with your health care provider to find out if you are at risk for hepatitis B infection. Hepatitis C Testing is recommended for:  Everyone born from 1945 through 1965.  Anyone with known risk factors for hepatitis C. Sexually transmitted infections (STIs)  Get screened for STIs, including gonorrhea and chlamydia, if: ? You are sexually active and are younger than 52 years of age. ? You are older than 52 years of age and your health care provider tells you that you are at risk for this type of infection. ? Your sexual activity has changed since you were last screened, and you are at increased risk for chlamydia or gonorrhea. Ask your health care provider   if you are at risk.  Ask your health care provider about whether you are at high risk for HIV. Your health care provider may recommend a prescription medicine to help prevent HIV infection. If you choose to take medicine to prevent HIV, you should first get tested for HIV. You should then be tested every 3 months for as long as you are taking the medicine. Pregnancy  If you are about to stop having your period (premenopausal) and  you may become pregnant, seek counseling before you get pregnant.  Take 400 to 800 micrograms (mcg) of folic acid every day if you become pregnant.  Ask for birth control (contraception) if you want to prevent pregnancy. Osteoporosis and menopause Osteoporosis is a disease in which the bones lose minerals and strength with aging. This can result in bone fractures. If you are 65 years old or older, or if you are at risk for osteoporosis and fractures, ask your health care provider if you should:  Be screened for bone loss.  Take a calcium or vitamin D supplement to lower your risk of fractures.  Be given hormone replacement therapy (HRT) to treat symptoms of menopause. Follow these instructions at home: Lifestyle  Do not use any products that contain nicotine or tobacco, such as cigarettes, e-cigarettes, and chewing tobacco. If you need help quitting, ask your health care provider.  Do not use street drugs.  Do not share needles.  Ask your health care provider for help if you need support or information about quitting drugs. Alcohol use  Do not drink alcohol if: ? Your health care provider tells you not to drink. ? You are pregnant, may be pregnant, or are planning to become pregnant.  If you drink alcohol: ? Limit how much you use to 0-1 drink a day. ? Limit intake if you are breastfeeding.  Be aware of how much alcohol is in your drink. In the [place] one drink equals one 12 oz bottle of beer (355 mL), one 5 oz glass of wine (148 mL), or one 1 oz glass of hard liquor (44 mL). General instructions  Schedule regular health, dental, and eye exams.  Stay current with your vaccines.  Tell your health care provider if: ? You often feel depressed. ? You have ever been abused or do not feel safe at home. Summary  Adopting a healthy lifestyle and getting preventive care are important in promoting health and wellness.  Follow your health care provider's instructions about healthy  diet, exercising, and getting tested or screened for diseases.  Follow your health care provider's instructions on monitoring your cholesterol and blood pressure. This information is not intended to replace advice given to you by your health care provider. Make sure you discuss any questions you have with your health care provider. Document Revised: 02/19/2018 Document Reviewed: 02/19/2018 Elsevier Patient Education  2021 Elsevier Inc.  

## 2020-04-08 NOTE — Progress Notes (Signed)
This visit occurred during the SARS-CoV-2 public health emergency.  Safety protocols were in place, including screening questions prior to the visit, additional usage of staff PPE, and extensive cleaning of exam room while observing appropriate contact time as indicated for disinfecting solutions.    Patient ID: Lindsay Burns, female  DOB: Apr 07, 1970, 50 y.o.   MRN: [id number] Patient Care Team    Relationship Specialty Notifications Start End  Ma Hillock, DO PCP - General Family Medicine  11/25/14   Mauri Pole, MD Consulting Physician Gastroenterology  09/03/19     Chief Complaint  Patient presents with  . Annual Exam    Pt is not fasting    Subjective:  Lindsay Burns is a 50 y.o.  Female  present for CPE/CMC. All past medical history, surgical history, allergies, family history, immunizations, medications and social history were updated in the electronic medical record today. All recent labs, ED visits and hospitalizations within the last year were reviewed.  Health maintenance:  Colonoscopy: No family or personal history of colon cancer, screen completed 8/25/2021colon polyps present, by Dr. Havery Moros- 7 yr follow up  Mammogram: 02/2018; [facility]- implants. Family history maternal aunt of breast cancer.  Cervical cancer screening: 2017 NL/HEg HPV, PCP- 5 year f/u. Immunizations: Flu vaccination UTD 2021(encourgaed yearly) and tetanus UTD 2016. Shingrix #1 provided today )nurse visit 2-6  Months for #2 shingrix. covid series and booster completed Infectious disease screening: Completed in 2014, negative DEXA: routine screen Assistive device: none Oxygen [id number] Patient has a Dental home. Hospitalizations/ED visits: reviewed  ADD (attention deficit disorder) She has been on Strattera for many years.  She denies any unintentional weight loss. She has been working out 5x a week. She denies any side effects.Last appt She stated she was being more forgetful,  low motivation, more silly mistakes. Low dose adderal was added to her regimen. Today she states the medication combo worked well for her.   Vit D: She has a history of low vitamin D.  She was supplied with 12 weeks of high-dose twice over the last year. She had been instructed to also start OTC vit d .   Toenail fungus:  She would like to consider trying medication for toenail fungus treatment again. Terbinafine caused her constipation last time she tried.    Depression screen [facility] 2/9 04/08/2020 08/12/2018 01/28/2018 08/09/2015 11/25/2014  Decreased Interest 0 0 0 0 0  Down, Depressed, Hopeless 0 0 0 0 0  PHQ - 2 Score 0 0 0 0 0   No flowsheet data found.   Immunization History  Administered Date(s) Administered  . Influenza,inj,Quad PF,6+ Mos 02/01/2020  . PFIZER(Purple Top)SARS-COV-2 Vaccination 05/25/2019, 06/13/2019, 02/01/2020  . Tdap 11/25/2014  . Zoster Recombinat (Shingrix) 04/08/2020    Past Medical History:  Diagnosis Date  . ADD (attention deficit disorder)    dx: 2006  . Allergy    seasonal  . B12 deficiency   . Basal cell carcinoma    left side of face  . Sleep disorder    No Known Allergies Past Surgical History:  Procedure Laterality Date  . BASAL CELL CARCINOMA EXCISION Left    left face  . BREAST ENHANCEMENT SURGERY Bilateral    1999  . DILATION AND CURETTAGE OF UTERUS  2004  . TEMPOROMANDIBULAR JOINT SURGERY Bilateral 1987   Family History  Problem Relation Age of Onset  . Alcohol abuse Mother        was in [place]  . Breast cancer Mother 68  .  Depression Mother   . Esophageal cancer Mother   . Alcohol abuse Father        was in [place]  . Lung cancer Father   . Brain cancer Father   . Breast cancer Paternal Grandmother   . Colon cancer Paternal Uncle   . Colitis Maternal Grandmother   . Colon polyps Neg Hx   . Rectal cancer Neg Hx   . Stomach cancer Neg Hx    Social History   Social History Narrative   G3P2. Patient is married. Her husband  works at [employer]. 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 She lives with her husband, son and daughter.   - She feels safe in her relationship, she wears her seatbelt, she wears a bike helmet, there are smoke alarms in her home. There are no firearms in her home.   - She drinks alcohol occasionally, she drinks caffeinated beverages, she does not take vitamins or herbal remedies.   - She exercises regularly.    Allergies as of 04/08/2020   No Known Allergies     Medication List       Accurate as of April 08, 2020  3:14 PM. If you have any questions, ask your nurse or doctor.        amphetamine-dextroamphetamine 5 MG tablet Commonly known as: Adderall Take 1 tablet (5 mg total) by mouth 2 (two) times daily with a meal. What changed: Another medication with the same name was added. Make sure you understand how and when to take each. Changed by: Howard Pouch, DO   amphetamine-dextroamphetamine 5 MG tablet Commonly known as: Adderall Take 1 tablet (5 mg total) by mouth in the morning and at bedtime. What changed: You were already taking a medication with the same name, and this prescription was added. Make sure you understand how and when to take each. Changed by: Howard Pouch, DO   atomoxetine 40 MG capsule Commonly known as: Strattera Take 1 capsule (40 mg total) by mouth daily.   levocetirizine 5 MG tablet Commonly known as: XYZAL Take 5 mg by mouth every evening.   loratadine 10 MG tablet Commonly known as: CLARITIN Take 10 mg by mouth daily.   MULTIVITAMIN PO Take by mouth.       All past medical history, surgical history, allergies, family history, immunizations andmedications were updated in the EMR today and reviewed under the history and medication portions of their EMR.     No results found for this or any previous visit (from the past 2160 hour(s)).   ROS: 14 pt review of systems performed and negative (unless  mentioned in an HPI)  Objective: BP 107/70   Pulse 91   Temp (!) 97.3 F (36.3 C) (Oral)   Ht 5' 6.5" (1.689 m)   Wt 134 lb (60.8 kg)   SpO2 100%   BMI 21.30 kg/m  Gen: Afebrile. No acute distress. Nontoxic in appearance, well-developed, well-nourished,  Pleasant female.  HENT: AT. [place]. Bilateral TM visualized and normal in appearance, normal external auditory canal. MMM, no oral lesions, adequate dentition. Bilateral nares within normal limits. Throat without erythema, ulcerations or exudates. no Cough on exam, no hoarseness on exam. Eyes:Pupils Equal Round Reactive to light, Extraocular movements intact,  Conjunctiva without redness, discharge or icterus. Neck/lymp/endocrine: Supple,no lymphadenopathy, no thyromegaly CV: RRR no murmur, no edema, +2/4 P posterior tibialis pulses.  Chest: CTAB, no wheeze, rhonchi or crackles. Normal  Respiratory effort. good Air  movement. Abd: Soft. flat. NTND. BS present. no Masses palpated. No hepatosplenomegaly. No rebound tenderness or guarding. Skin: no rashes, purpura or petechiae. Warm and well-perfused. Skin intact. Neuro/Msk:  Normal gait. PERLA. EOMi. Alert. Oriented x3.  Cranial nerves II through XII intact. Muscle strength 5/5 upper/lower extremity. DTRs equal bilaterally. Psych: Normal affect, dress and demeanor. Normal speech. Normal thought content and judgment.   No exam data present  Assessment/plan: Lindsay Burns is a 50 y.o. female present for CPE/CMC Attention deficit disorder (ADD) without hyperactivity - stable.  - NCCS database reviewed 04/08/20 - uds updated today and contract updated - continue   Strattera 40 mg QD - continue adderall 5 mg BID - f/u 5.5 mos face to face.  Vit  D/b12: She did start MV with D Need for shingles vaccine administered todaytoday Vitamin D deficiency - VITAMIN D 25 Hydroxy (Vit-D Deficiency, Fractures) B12 deficiency - Vitamin B12 Diabetes mellitus screenin - Hemoglobin  A1c Screening for deficiency anemia - CBC with Differential/Platelet Encounter for long-term current use of medication - Comprehensive metabolic panel Lipid screening - Lipid panel Encounter for screening mammogram for malignant neoplasm of breast/Family history of breast cancer - MM DIGITAL SCREENING W/ IMPLANTS BILATERAL; Future Sleep disorde - TSH Encounter for preventive health examination Patient was encouraged to exercise greater than 150 minutes a week. Patient was encouraged to choose a diet filled with fresh fruits and vegetables, and lean meats. AVS provided to patient today for education/recommendation on gender specific health and safety maintenance. Colonoscopy: No family or personal history of colon cancer, screen completed 8/25/2021colon polyps present, by Dr. Havery Moros- 7 yr follow up  Mammogram: 02/2018; [facility]- implants. Family history maternal aunt of breast cancer.  Cervical cancer screening: 2017 NL/HEg HPV, PCP- 5 year f/u. Immunizations: Flu vaccination UTD 2021(encourgaed yearly) and tetanus UTD 2016. Shingrix #1 provided today )nurse visit 2-6  Months for #2 shingrix. covid series and booster completed Infectious disease screening: Completed in 2014, negative DEXA: routine screen  Return in about 5 months (around 09/22/2020) for CMC (30 min) and 1 yr cpe.    Orders Placed This Encounter  Procedures  . MM DIGITAL SCREENING W/ IMPLANTS BILATERAL  . Varicella-zoster vaccine IM  . CBC with Differential/Platelet  . Comprehensive metabolic panel  . Lipid panel  . TSH  . DRUG MONITORING, PANEL 8 WITH CONFIRMATION, URINE  . Vitamin B12  . VITAMIN D 25 Hydroxy (Vit-D Deficiency, Fractures)  . Hemoglobin A1c   Meds ordered this encounter  Medications  . atomoxetine (STRATTERA) 40 MG capsule    Sig: Take 1 capsule (40 mg total) by mouth daily.    Dispense:  90 capsule    Refill:  1  . amphetamine-dextroamphetamine (ADDERALL) 5 MG tablet    Sig: Take 1 tablet  (5 mg total) by mouth 2 (two) times daily with a meal.    Dispense:  180 tablet    Refill:  0  . amphetamine-dextroamphetamine (ADDERALL) 5 MG tablet    Sig: Take 1 tablet (5 mg total) by mouth in the morning and at bedtime.    Dispense:  180 tablet    Refill:  0    May refill after 06/24/2020   Referral Orders  No referral(s) requested today     Electronically signed by: Howard Pouch, [place]

## 2020-04-09 LAB — COMPREHENSIVE METABOLIC PANEL
AG Ratio: 1.4 (calc) (ref 1.0–2.5)
ALT: 8 U/L (ref 6–29)
AST: 15 U/L (ref 10–35)
Albumin: 4.2 g/dL (ref 3.6–5.1)
Alkaline phosphatase (APISO): 66 U/L (ref 37–153)
BUN: 12 mg/dL (ref 7–25)
CO2: 25 mmol/L (ref 20–32)
Calcium: 9.6 mg/dL (ref 8.6–10.4)
Chloride: 103 mmol/L (ref 98–110)
Creat: 0.59 mg/dL (ref 0.50–1.05)
Globulin: 2.9 g/dL (calc) (ref 1.9–3.7)
Glucose, Bld: 84 mg/dL (ref 65–99)
Potassium: 4.1 mmol/L (ref 3.5–5.3)
Sodium: 139 mmol/L (ref 135–146)
Total Bilirubin: 0.3 mg/dL (ref 0.2–1.2)
Total Protein: 7.1 g/dL (ref 6.1–8.1)

## 2020-04-09 LAB — DRUG MONITORING, PANEL 8 WITH CONFIRMATION, URINE
6 Acetylmorphine: NEGATIVE ng/mL (ref <10)
Alcohol Metabolites: NEGATIVE ng/mL
Amphetamines: NEGATIVE ng/mL (ref <500)
Benzodiazepines: NEGATIVE ng/mL (ref <100)
Buprenorphine, Urine: NEGATIVE ng/mL (ref <5)
Cocaine Metabolite: NEGATIVE ng/mL (ref <150)
Creatinine: 28 mg/dL
MDMA: NEGATIVE ng/mL (ref <500)
Marijuana Metabolite: NEGATIVE ng/mL (ref <20)
Opiates: NEGATIVE ng/mL (ref <100)
Oxidant: NEGATIVE ug/mL
Oxycodone: NEGATIVE ng/mL (ref <100)
pH: 7.3 (ref 4.5–9.0)

## 2020-04-09 LAB — TSH: TSH: 2.73 mIU/L

## 2020-04-09 LAB — CBC WITH DIFFERENTIAL/PLATELET
Absolute Monocytes: 666 cells/uL (ref 200–950)
Basophils Absolute: 28 cells/uL (ref 0–200)
Basophils Relative: 0.5 %
Eosinophils Absolute: 179 cells/uL (ref 15–500)
Eosinophils Relative: 3.2 %
HCT: 40.2 % (ref 35.0–45.0)
Hemoglobin: 13.4 g/dL (ref 11.7–15.5)
Lymphs Abs: 1814 cells/uL (ref 850–3900)
MCH: 30.9 pg (ref 27.0–33.0)
MCHC: 33.3 g/dL (ref 32.0–36.0)
MCV: 92.6 fL (ref 80.0–100.0)
MPV: 9.6 fL (ref 7.5–12.5)
Monocytes Relative: 11.9 %
Neutro Abs: 2912 cells/uL (ref 1500–7800)
Neutrophils Relative %: 52 %
Platelets: 334 10*3/uL (ref 140–400)
RBC: 4.34 10*6/uL (ref 3.80–5.10)
RDW: 13.3 % (ref 11.0–15.0)
Total Lymphocyte: 32.4 %
WBC: 5.6 10*3/uL (ref 3.8–10.8)

## 2020-04-09 LAB — LIPID PANEL
Cholesterol: 195 mg/dL (ref <200)
HDL: 75 mg/dL (ref >=50)
LDL Cholesterol (Calc): 94 mg/dL (calc)
Non-HDL Cholesterol (Calc): 120 mg/dL (calc) (ref <130)
Total CHOL/HDL Ratio: 2.6 (calc) (ref <5.0)
Triglycerides: 165 mg/dL — ABNORMAL HIGH (ref <150)

## 2020-04-09 LAB — HEMOGLOBIN A1C
Hgb A1c MFr Bld: 5.1 % of total Hgb (ref <5.7)
Mean Plasma Glucose: 100 mg/dL
eAG (mmol/L): 5.5 mmol/L

## 2020-04-09 LAB — DM TEMPLATE

## 2020-04-09 LAB — VITAMIN B12: Vitamin B-12: 721 pg/mL (ref 200–1100)

## 2020-04-09 LAB — VITAMIN D 25 HYDROXY (VIT D DEFICIENCY, FRACTURES): Vit D, 25-Hydroxy: 31 ng/mL (ref 30–100)

## 2020-04-13 ENCOUNTER — Telehealth: Payer: Self-pay

## 2020-04-13 NOTE — Telephone Encounter (Signed)
Pt informed and will update pharmacy at next visit

## 2020-04-13 NOTE — Telephone Encounter (Signed)
Pt would like to change her controlled medication pharmacy for [facility] [place]. Please advise

## 2020-04-13 NOTE — Telephone Encounter (Signed)
We can do so at her next appt. Can not transfer controlled substance scripts one written.

## 2020-09-15 ENCOUNTER — Ambulatory Visit: Payer: BC Managed Care – PPO

## 2020-11-17 ENCOUNTER — Ambulatory Visit: Payer: BC Managed Care – PPO

## 2021-01-02 ENCOUNTER — Encounter: Payer: Self-pay | Admitting: Family Medicine

## 2021-01-02 ENCOUNTER — Other Ambulatory Visit: Payer: Self-pay

## 2021-01-02 ENCOUNTER — Ambulatory Visit: Payer: BC Managed Care – PPO | Admitting: Family Medicine

## 2021-01-02 VITALS — BP 94/58 | HR 77 | Temp 98.0°F | Ht 67.0 in | Wt 134.0 lb

## 2021-01-02 DIAGNOSIS — F988 Other specified behavioral and emotional disorders with onset usually occurring in childhood and adolescence: Secondary | ICD-10-CM | POA: Diagnosis not present

## 2021-01-02 MED ORDER — METHYLPHENIDATE HCL ER (OSM) 18 MG PO TBCR: 18.0000 mg | EXTENDED_RELEASE_TABLET | Freq: Every day | ORAL | 0 refills | Status: DC

## 2021-01-02 NOTE — Patient Instructions (Signed)
  Great to see you today.  I have refilled the medication(s) we provide.   If labs were collected, we will inform you of lab results once received either by echart message or telephone call.   - echart message- for normal results that have been seen by the patient already.   - telephone call: abnormal results or if patient has not viewed results in their echart.  Start concerta in the morning. It is only one time a day.  Follow up in 4 weeks. If we need to we can adjust dose at that time and fill for 90 days.

## 2021-01-02 NOTE — Progress Notes (Signed)
This visit occurred during the SARS-CoV-2 public health emergency.  Lindsay protocols were in place, including screening questions prior to the visit, additional usage of staff PPE, and extensive cleaning of exam room while observing appropriate contact time as indicated for disinfecting solutions.    Patient ID: Lindsay Burns, female  DOB: 1970/12/22, 50 y.o.   MRN: [id number] Patient Care Team    Relationship Specialty Notifications Start End  Ma Hillock, DO PCP - General Family Medicine  11/25/14   Mauri Pole, MD Consulting Physician Gastroenterology  09/03/19     Chief Complaint  Patient presents with   ADHD    Lindsay Burns; pt is not fasting    Subjective: Lindsay Burns is a 50 y.o.  Female  present for Lindsay Burns. All past medical history, surgical history, allergies, family history, immunizations, medications and social history were updated in the electronic medical record today. All recent labs, ED visits and hospitalizations within the last year were reviewed. ADD (attention deficit disorder) She has been on Strattera for many years.  She reports she had been on Vyvanse prior to the Strattera.  She thinks she had heart racing on Vyvanse, therefore Strattera was tried.  She states that Lindsay Burns is okay but she does not feel it is as effective.  We have prescribed low-dose Adderall as well, and she says it is okay although short acting.  She does admit she is very forgetful and frequently forgets any afternoon doses of medications.   Vit D: She has a history of low vitamin D.  She is taking her multivitamin.  Depression screen Lindsay Burns 2/9 01/02/2021 04/08/2020 08/12/2018 01/28/2018 08/09/2015  Decreased Interest 0 0 0 0 0  Down, Depressed, Hopeless 0 0 0 0 0  PHQ - 2 Score 0 0 0 0 0   No flowsheet data found.   Immunization History  Administered Date(s) Administered   Influenza,inj,Quad PF,6+ Mos 02/01/2020   Influenza-Unspecified 11/28/2020   PFIZER(Purple  Top)SARS-COV-2 Vaccination 05/25/2019, 06/13/2019, 02/01/2020   Pfizer Covid-19 Vaccine Bivalent Booster 56yrs & up 11/28/2020   Tdap 11/25/2014   Zoster Recombinat (Shingrix) 04/08/2020    Past Medical History:  Diagnosis Date   ADD (attention deficit disorder)    dx: 2006   Allergy    seasonal   B12 deficiency    Basal cell carcinoma    left side of face   Sleep disorder    No Known Allergies Past Surgical History:  Procedure Laterality Date   BASAL CELL CARCINOMA EXCISION Left    left face   BREAST ENHANCEMENT SURGERY Bilateral    1999   DILATION AND CURETTAGE OF UTERUS  2004   TEMPOROMANDIBULAR JOINT SURGERY Bilateral 1987   Family History  Problem Relation Age of Onset   Alcohol abuse Mother        was in [place]   Breast cancer Mother 92   Depression Mother    Esophageal cancer Mother    Alcohol abuse Father        was in [place]   Lung cancer Father    Brain cancer Father    Breast cancer Paternal Grandmother    Colon cancer Paternal Uncle    Colitis Maternal Grandmother    Colon polyps Neg Hx    Rectal cancer Neg Hx    Stomach cancer Neg Hx    Social History   Social History Narrative   G3P2. Patient is married. Her husband works at [employer]. 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 She lives with her husband, son and daughter.   - She feels safe in her relationship, she wears her seatbelt, she wears a bike helmet, there are smoke alarms in her home. There are no firearms in her home.   - She drinks alcohol occasionally, she drinks caffeinated beverages, she does not take vitamins or herbal remedies.   - She exercises regularly.    Allergies as of 01/02/2021   No Known Allergies      Medication List        Accurate as of January 02, 2021  4:48 PM. If you have any questions, ask your nurse or doctor.          STOP taking these medications    amphetamine-dextroamphetamine 5 MG tablet Commonly known as:  Adderall Stopped by: Lindsay Pouch, DO   atomoxetine 40 MG capsule Commonly known as: Strattera Stopped by: Lindsay Pouch, DO       TAKE these medications    levocetirizine 5 MG tablet Commonly known as: XYZAL Take 5 mg by mouth every evening.   loratadine 10 MG tablet Commonly known as: CLARITIN Take 10 mg by mouth daily.   methylphenidate 18 MG CR tablet Commonly known as: Concerta Take 1 tablet (18 mg total) by mouth daily. Started by: Lindsay Pouch, DO   MULTIVITAMIN PO Take by mouth.        All past medical history, surgical history, allergies, family history, immunizations andmedications were updated in the EMR today and reviewed under the history and medication portions of their EMR.     No results found for this or any previous visit (from the past 2160 hour(s)).   ROS: 14 pt review of systems performed and negative (unless mentioned in an HPI)  Objective: BP (!) 94/58   Pulse 77   Temp 98 F (36.7 C) (Oral)   Ht 5\' 7"  (1.702 m)   Wt 134 lb (60.8 kg)   SpO2 97%   BMI 20.99 kg/m  Gen: Afebrile. No acute distress.  Nontoxic, very pleasant female. HENT: AT. [place].  Cough.  No hoarseness. Eyes:Pupils Equal Round Reactive to light, Extraocular movements intact,  Conjunctiva without redness, discharge or icterus. CV: RRR  Chest: CTAB, no wheeze or crackles Neuro:Normal gait. PERLA. EOMi. Alert. Oriented x3 Psych: Normal affect, dress and demeanor. Normal speech. Normal thought content and judgment.   No results found.  Assessment/plan: Taneil Lazarus is a 50 y.o. female present for CPE/CMC Attention deficit disorder (ADD) without hyperactivity -Patient would like to try different medication for better controlled of her ADD.  We discussed Vyvanse versus Concerta and Concerta is on her formulary.  She would like to start a trial of Concerta. - NCCS database reviewed 01/02/21 - uds updated today and contract updated -Discontinue Strattera 40 mg QD and  Adderall. -Start Concerta 18 grams p.o. daily a.m. #30 - f/u 4 weeks can be virtual video .  If needed dose can be adjusted at that time and once we are on a routine dose, will get medicine called in in 90 days at a time at Lindsay Burns.  Vit  D/b12: She did start MV with D   Return in about 25 days (around 01/27/2021) for Lindsay Burns (30 min)- can be VV.    No orders of the defined types were placed in this encounter.  Meds ordered this encounter  Medications   methylphenidate (CONCERTA) 18 MG PO CR tablet    Sig: Take 1 tablet (18  mg total) by mouth daily.    Dispense:  30 tablet    Refill:  0    Referral Orders  No referral(s) requested today     Electronically signed by: Lindsay Burns, [place]

## 2021-01-30 ENCOUNTER — Telehealth (INDEPENDENT_AMBULATORY_CARE_PROVIDER_SITE_OTHER): Payer: BC Managed Care – PPO | Admitting: Family Medicine

## 2021-01-30 ENCOUNTER — Other Ambulatory Visit: Payer: Self-pay

## 2021-01-30 ENCOUNTER — Encounter: Payer: Self-pay | Admitting: Family Medicine

## 2021-01-30 VITALS — BP 114/73 | HR 98 | Ht 67.0 in | Wt 133.0 lb

## 2021-01-30 DIAGNOSIS — F988 Other specified behavioral and emotional disorders with onset usually occurring in childhood and adolescence: Secondary | ICD-10-CM

## 2021-01-30 MED ORDER — METHYLPHENIDATE HCL ER (OSM) 27 MG PO TBCR: 27.0000 mg | EXTENDED_RELEASE_TABLET | ORAL | 0 refills | Status: DC

## 2021-01-30 NOTE — Progress Notes (Signed)
VIRTUAL VISIT VIA VIDEO  I connected with Lindsay Burns on 01/30/21 at  2:00 PM EST by a video enabled telemedicine application and verified that I am speaking with the correct person using two identifiers. Location patient: Home Location provider: [facility], Office Persons participating in the virtual visit: Patient, Dr. Raoul Pitch and Cyndra Numbers, CMA  I discussed the limitations of evaluation and management by telemedicine and the availability of in person appointments. The patient expressed understanding and agreed to proceed.      Patient ID: Lindsay Burns, female  DOB: [birth year], 50 y.o.   MRN: [id number] Patient Care Team    Relationship Specialty Notifications Start End  Ma Hillock, DO PCP - General Family Medicine  11/25/14   Mauri Pole, MD Consulting Physician Gastroenterology  09/03/19     Chief Complaint  Patient presents with   ADHD    [facility];     Subjective: Lindsay Burns is a 50 y.o.  Female  present for [facility]. All past medical history, surgical history, allergies, family history, immunizations, medications and social history were updated in the electronic medical record today. All recent labs, ED visits and hospitalizations within the last year were reviewed.  ADD (attention deficit disorder) Pt reports she feels the concerta is working better than Strattera. She does feel like she may need a little higher dose.  Prior note She has been on Strattera for many years.  She reports she had been on Vyvanse prior to the Strattera.  She thinks she had heart racing on Vyvanse, therefore Strattera was tried.  She states that Lindsay Burns is okay but she does not feel it is as effective.  We have prescribed low-dose Adderall as well, and she says it is okay although short acting.  She does admit she is very forgetful and frequently forgets any afternoon doses of medications.   Vit D: She has a history of low vitamin D.  She is taking her  multivitamin.  Depression screen [facility] 2/9 01/30/2021 01/02/2021 04/08/2020 08/12/2018 01/28/2018  Decreased Interest 0 0 0 0 0  Down, Depressed, Hopeless 0 0 0 0 0  PHQ - 2 Score 0 0 0 0 0   No flowsheet data found.   Immunization History  Administered Date(s) Administered   Influenza,inj,Quad PF,6+ Mos 02/01/2020   Influenza-Unspecified 11/28/2020   PFIZER(Purple Top)SARS-COV-2 Vaccination 05/25/2019, 06/13/2019, 02/01/2020   Pfizer Covid-19 Vaccine Bivalent Booster 71yrs & up 11/28/2020   Tdap 11/25/2014   Zoster Recombinat (Shingrix) 04/08/2020    Past Medical History:  Diagnosis Date   ADD (attention deficit disorder)    dx: 2006   Allergy    seasonal   B12 deficiency    Basal cell carcinoma    left side of face   Sleep disorder    No Known Allergies Past Surgical History:  Procedure Laterality Date   BASAL CELL CARCINOMA EXCISION Left    left face   BREAST ENHANCEMENT SURGERY Bilateral    1999   DILATION AND CURETTAGE OF UTERUS  2004   TEMPOROMANDIBULAR JOINT SURGERY Bilateral 1987   Family History  Problem Relation Age of Onset   Alcohol abuse Mother        was in [place]   Breast cancer Mother 76   Depression Mother    Esophageal cancer Mother    Alcohol abuse Father        was in [place]   Lung cancer Father    Brain cancer Father  Breast cancer Paternal Grandmother    Colon cancer Paternal Uncle    Colitis Maternal Grandmother    Colon polyps Neg Hx    Rectal cancer Neg Hx    Stomach cancer Neg Hx    Social History   Social History Narrative   G3P2. Patient is married. Her husband works at [employer]. 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 She lives with her husband, son and daughter.   - She feels safe in her relationship, she wears her seatbelt, she wears a bike helmet, there are smoke alarms in her home. There are no firearms in her home.   - She drinks alcohol occasionally, she drinks caffeinated beverages, she  does not take vitamins or herbal remedies.   - She exercises regularly.    Allergies as of 01/30/2021   No Known Allergies      Medication List        Accurate as of January 30, 2021  2:27 PM. If you have any questions, ask your nurse or doctor.          levocetirizine 5 MG tablet Commonly known as: XYZAL Take 5 mg by mouth every evening.   loratadine 10 MG tablet Commonly known as: CLARITIN Take 10 mg by mouth daily.   methylphenidate 18 MG CR tablet Commonly known as: Concerta Take 1 tablet (18 mg total) by mouth daily. What changed: Another medication with the same name was added. Make sure you understand how and when to take each. Changed by: Howard Pouch, DO   methylphenidate 27 MG CR tablet Commonly known as: Concerta Take 1 tablet (27 mg total) by mouth every morning. What changed: You were already taking a medication with the same name, and this prescription was added. Make sure you understand how and when to take each. Changed by: Howard Pouch, DO   methylphenidate 27 MG CR tablet Commonly known as: Concerta Take 1 tablet (27 mg total) by mouth every morning. What changed: You were already taking a medication with the same name, and this prescription was added. Make sure you understand how and when to take each. Changed by: Howard Pouch, DO   MULTIVITAMIN PO Take by mouth.        All past medical history, surgical history, allergies, family history, immunizations andmedications were updated in the EMR today and reviewed under the history and medication portions of their EMR.     No results found for this or any previous visit (from the past 2160 hour(s)).   ROS: 14 pt review of systems performed and negative (unless mentioned in an HPI)  Objective: BP 114/73   Pulse 98   Ht 5\' 7"  (1.702 m)   Wt 133 lb (60.3 kg)   LMP 01/17/2021   BMI 20.83 kg/m  Gen: Afebrile. No acute distress.  HENT: AT. [place].  Eyes:Pupils Equal Round Reactive to light,  Extraocular movements intact,  Conjunctiva without redness, discharge or icterus. Neuro: . Alert. Oriented x3 Psych: Normal affect, dress and demeanor. Normal speech. Normal thought content and judgment.  No results found.  Assessment/plan: Lindsay Burns is a 50 y.o. female present for [place] Attention deficit disorder (ADD) without hyperactivity -improved, but not at goal.  - increase concerta to 27mg  qd #90 x2 scripts - [facility] database reviewed 01/30/21 - uds and contract UTD   Vit  D/b12: She did start MV with D   No follow-ups on file.    No orders of the  defined types were placed in this encounter.  Meds ordered this encounter  Medications   methylphenidate (CONCERTA) 27 MG PO CR tablet    Sig: Take 1 tablet (27 mg total) by mouth every morning.    Dispense:  90 tablet    Refill:  0   methylphenidate (CONCERTA) 27 MG PO CR tablet    Sig: Take 1 tablet (27 mg total) by mouth every morning.    Dispense:  90 tablet    Refill:  0    May fill ~90 days after prior script     Referral Orders  No referral(s) requested today     Electronically signed by: Howard Pouch, [place]

## 2021-03-30 DIAGNOSIS — B078 Other viral warts: Secondary | ICD-10-CM | POA: Diagnosis not present

## 2021-03-30 DIAGNOSIS — D485 Neoplasm of uncertain behavior of skin: Secondary | ICD-10-CM | POA: Diagnosis not present

## 2021-03-30 DIAGNOSIS — L578 Other skin changes due to chronic exposure to nonionizing radiation: Secondary | ICD-10-CM | POA: Diagnosis not present

## 2021-03-30 DIAGNOSIS — Z85828 Personal history of other malignant neoplasm of skin: Secondary | ICD-10-CM | POA: Diagnosis not present

## 2021-03-30 DIAGNOSIS — L821 Other seborrheic keratosis: Secondary | ICD-10-CM | POA: Diagnosis not present

## 2021-04-18 ENCOUNTER — Other Ambulatory Visit: Payer: Self-pay

## 2021-04-19 ENCOUNTER — Ambulatory Visit (INDEPENDENT_AMBULATORY_CARE_PROVIDER_SITE_OTHER): Payer: BC Managed Care – PPO | Admitting: Family Medicine

## 2021-04-19 ENCOUNTER — Encounter: Payer: Self-pay | Admitting: Family Medicine

## 2021-04-19 VITALS — BP 87/58 | HR 78 | Temp 97.6°F | Ht 66.75 in | Wt 132.0 lb

## 2021-04-19 DIAGNOSIS — Z130 Encounter for screening for diseases of the blood and blood-forming organs and certain disorders involving the immune mechanism: Secondary | ICD-10-CM | POA: Diagnosis not present

## 2021-04-19 DIAGNOSIS — Z0000 Encounter for general adult medical examination without abnormal findings: Secondary | ICD-10-CM

## 2021-04-19 DIAGNOSIS — Z803 Family history of malignant neoplasm of breast: Secondary | ICD-10-CM | POA: Diagnosis not present

## 2021-04-19 DIAGNOSIS — Z131 Encounter for screening for diabetes mellitus: Secondary | ICD-10-CM

## 2021-04-19 DIAGNOSIS — F988 Other specified behavioral and emotional disorders with onset usually occurring in childhood and adolescence: Secondary | ICD-10-CM | POA: Diagnosis not present

## 2021-04-19 DIAGNOSIS — G479 Sleep disorder, unspecified: Secondary | ICD-10-CM | POA: Diagnosis not present

## 2021-04-19 DIAGNOSIS — Z79899 Other long term (current) drug therapy: Secondary | ICD-10-CM

## 2021-04-19 DIAGNOSIS — Z13220 Encounter for screening for lipoid disorders: Secondary | ICD-10-CM | POA: Diagnosis not present

## 2021-04-19 DIAGNOSIS — E559 Vitamin D deficiency, unspecified: Secondary | ICD-10-CM | POA: Diagnosis not present

## 2021-04-19 DIAGNOSIS — Z1231 Encounter for screening mammogram for malignant neoplasm of breast: Secondary | ICD-10-CM

## 2021-04-19 DIAGNOSIS — E538 Deficiency of other specified B group vitamins: Secondary | ICD-10-CM | POA: Diagnosis not present

## 2021-04-19 LAB — LIPID PANEL
Cholesterol: 190 mg/dL (ref 0–200)
HDL: 75.9 mg/dL (ref >39.00)
LDL Cholesterol: 93 mg/dL (ref 0–99)
NonHDL: 114.14
Total CHOL/HDL Ratio: 3
Triglycerides: 108 mg/dL (ref 0.0–149.0)
VLDL: 21.6 mg/dL (ref 0.0–40.0)

## 2021-04-19 LAB — COMPREHENSIVE METABOLIC PANEL
ALT: 9 U/L (ref 0–35)
AST: 15 U/L (ref 0–37)
Albumin: 4.1 g/dL (ref 3.5–5.2)
Alkaline Phosphatase: 64 U/L (ref 39–117)
BUN: 14 mg/dL (ref 6–23)
CO2: 28 mEq/L (ref 19–32)
Calcium: 9.6 mg/dL (ref 8.4–10.5)
Chloride: 103 mEq/L (ref 96–112)
Creatinine, Ser: 0.77 mg/dL (ref 0.40–1.20)
GFR: 88.8 mL/min (ref >60.00)
Glucose, Bld: 87 mg/dL (ref 70–99)
Potassium: 4.4 mEq/L (ref 3.5–5.1)
Sodium: 139 mEq/L (ref 135–145)
Total Bilirubin: 0.4 mg/dL (ref 0.2–1.2)
Total Protein: 6.8 g/dL (ref 6.0–8.3)

## 2021-04-19 LAB — TSH: TSH: 2.14 u[IU]/mL (ref 0.35–5.50)

## 2021-04-19 LAB — VITAMIN D 25 HYDROXY (VIT D DEFICIENCY, FRACTURES): VITD: 19.47 ng/mL — ABNORMAL LOW (ref 30.00–100.00)

## 2021-04-19 LAB — CBC
HCT: 39.2 % (ref 36.0–46.0)
Hemoglobin: 12.7 g/dL (ref 12.0–15.0)
MCHC: 32.4 g/dL (ref 30.0–36.0)
MCV: 93.3 fl (ref 78.0–100.0)
Platelets: 285 10*3/uL (ref 150.0–400.0)
RBC: 4.2 Mil/uL (ref 3.87–5.11)
RDW: 15.2 % (ref 11.5–15.5)
WBC: 4.2 10*3/uL (ref 4.0–10.5)

## 2021-04-19 LAB — VITAMIN B12: Vitamin B-12: 527 pg/mL (ref 211–911)

## 2021-04-19 LAB — HEMOGLOBIN A1C: Hgb A1c MFr Bld: 5.1 % (ref 4.6–6.5)

## 2021-04-19 MED ORDER — METHYLPHENIDATE HCL ER (OSM) 36 MG PO TBCR: 36.0000 mg | EXTENDED_RELEASE_TABLET | Freq: Every day | ORAL | 0 refills | Status: DC

## 2021-04-19 NOTE — Progress Notes (Signed)
This visit occurred during the SARS-CoV-2 public health emergency.  Safety protocols were in place, including screening questions prior to the visit, additional usage of staff PPE, and extensive cleaning of exam room while observing appropriate contact time as indicated for disinfecting solutions.    Patient ID: Lindsay Burns, female  DOB: [birth year], 51 y.o.   MRN: [id number] Patient Care Team    Relationship Specialty Notifications Start End  Ma Hillock, DO PCP - General Family Medicine  11/25/14   Mauri Pole, MD Consulting Physician Gastroenterology  09/03/19     Chief Complaint  Patient presents with   Annual Exam    Pt is fasting    Subjective: Lindsay Burns is a 51 y.o.  Female  present for [place] All past medical history, surgical history, allergies, family history, immunizations, medications and social history were updated in the electronic medical record today. All recent labs, ED visits and hospitalizations within the last year were reviewed.  Health maintenance:  Colonoscopy: No family or personal history of colon cancer, screen completed 8/25/2021colon polyps present, by Dr. Havery Moros- 7 yr follow up  Mammogram: 02/2018; [facility]- implants. Family history maternal aunt of breast cancer. > order placed.  Cervical cancer screening: 2017 NL/HEg HPV, PCP- 5 year f/u. She will make an appt for PAP Immunizations: Flu vaccination UTD 2022(encourgaed yearly) and tetanus UTD 2016. Shingrix #1 provided today> declined 2nd.  covid series and booster completed Infectious disease screening: Completed in 2014, negative. Hep c declined.  DEXA: routine screen Assistive device: none Oxygen NWG:NFAO Patient has a Dental home. Hospitalizations/ED visits: reviewed  ADD (attention deficit disorder) Pt reports she feels the concerta is working better than Strattera. We increased dose to concerta 27 last visit, she now feels the higher dose will be helpful.  Prior  note She has been on Strattera for many years.  She reports she had been on Vyvanse prior to the Strattera.  She thinks she had heart racing on Vyvanse, therefore Strattera was tried.  She states that Christianne Borrow is okay but she does not feel it is as effective.  We have prescribed low-dose Adderall as well, and she says it is okay although short acting.  She does admit she is very forgetful and frequently forgets any afternoon doses of medications.   Vit D: She has a history of low vitamin D.  She is taking her multivitamin.   Depression screen [facility] 2/9 01/30/2021 01/02/2021 04/08/2020 08/12/2018 01/28/2018  Decreased Interest 0 0 0 0 0  Down, Depressed, Hopeless 0 0 0 0 0  PHQ - 2 Score 0 0 0 0 0   No flowsheet data found.   Immunization History  Administered Date(s) Administered   Influenza,inj,Quad PF,6+ Mos 02/01/2020   Influenza-Unspecified 11/28/2020   PFIZER(Purple Top)SARS-COV-2 Vaccination 05/25/2019, 06/13/2019, 02/01/2020   Pfizer Covid-19 Vaccine Bivalent Booster 40yrs & up 11/28/2020   Tdap 11/25/2014   Zoster Recombinat (Shingrix) 04/08/2020    Past Medical History:  Diagnosis Date   ADD (attention deficit disorder)    dx: 2006   Allergy    seasonal   B12 deficiency    Basal cell carcinoma    left side of face   Sleep disorder    No Known Allergies Past Surgical History:  Procedure Laterality Date   BASAL CELL CARCINOMA EXCISION Left    left face   BREAST ENHANCEMENT SURGERY Bilateral    1999   DILATION AND CURETTAGE OF UTERUS  2004   TEMPOROMANDIBULAR JOINT SURGERY Bilateral 1987  Family History  Problem Relation Age of Onset   Alcohol abuse Mother        was in [place]   Breast cancer Mother 43   Depression Mother    Esophageal cancer Mother    Alcohol abuse Father        was in [place]   Lung cancer Father    Brain cancer Father    Breast cancer Paternal Grandmother    Colon cancer Paternal Uncle    Colitis Maternal Grandmother    Colon polyps Neg Hx     Rectal cancer Neg Hx    Stomach cancer Neg Hx    Social History   Social History Narrative   G3P2. Patient is married. Her husband works at [employer]. 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 She lives with her husband, son and daughter.   - She feels safe in her relationship, she wears her seatbelt, she wears a bike helmet, there are smoke alarms in her home. There are no firearms in her home.   - She drinks alcohol occasionally, she drinks caffeinated beverages, she does not take vitamins or herbal remedies.   - She exercises regularly.    Allergies as of 04/19/2021   No Known Allergies      Medication List        Accurate as of April 19, 2021  9:31 AM. If you have any questions, ask your nurse or doctor.          STOP taking these medications    loratadine 10 MG tablet Commonly known as: CLARITIN Stopped by: Howard Pouch, DO       TAKE these medications    levocetirizine 5 MG tablet Commonly known as: XYZAL Take 5 mg by mouth every evening.   methylphenidate 36 MG CR tablet Commonly known as: Concerta Take 1 tablet (36 mg total) by mouth daily. What changed:  medication strength how much to take Another medication with the same name was removed. Continue taking this medication, and follow the directions you see here. Changed by: Howard Pouch, DO   methylphenidate 36 MG CR tablet Commonly known as: Concerta Take 1 tablet (36 mg total) by mouth daily. What changed:  medication strength how much to take when to take this Another medication with the same name was removed. Continue taking this medication, and follow the directions you see here. Changed by: Howard Pouch, DO   MULTIVITAMIN PO Take by mouth.        All past medical history, surgical history, allergies, family history, immunizations andmedications were updated in the EMR today and reviewed under the history and medication portions of their EMR.      No results found for this or any previous visit (from the past 2160 hour(s)).    ROS 14 pt review of systems performed and negative (unless mentioned in an HPI)  Objective: BP (!) 87/58    Pulse 78    Temp 97.6 F (36.4 C) (Oral)    Ht 5' 6.75" (1.695 m)    Wt 132 lb (59.9 kg)    LMP 04/12/2021    SpO2 100%    BMI 20.83 kg/m  Physical Exam Vitals and nursing note reviewed.  Constitutional:      General: She is not in acute distress.    Appearance: Normal appearance. She is not ill-appearing or toxic-appearing.  HENT:     Head: Normocephalic and atraumatic.     Right Ear: Tympanic  membrane, ear canal and external ear normal. There is no impacted cerumen.     Left Ear: Tympanic membrane, ear canal and external ear normal. There is no impacted cerumen.     Nose: No congestion or rhinorrhea.     Mouth/Throat:     Mouth: Mucous membranes are moist.     Pharynx: Oropharynx is clear. No oropharyngeal exudate or posterior oropharyngeal erythema.  Eyes:     General: No scleral icterus.       Right eye: No discharge.        Left eye: No discharge.     Extraocular Movements: Extraocular movements intact.     Conjunctiva/sclera: Conjunctivae normal.     Pupils: Pupils are equal, round, and reactive to light.  Cardiovascular:     Rate and Rhythm: Normal rate and regular rhythm.     Pulses: Normal pulses.     Heart sounds: Normal heart sounds. No murmur heard.   No friction rub. No gallop.  Pulmonary:     Effort: Pulmonary effort is normal. No respiratory distress.     Breath sounds: Normal breath sounds. No stridor. No wheezing, rhonchi or rales.  Chest:     Chest wall: No tenderness.  Abdominal:     General: Abdomen is flat. Bowel sounds are normal. There is no distension.     Palpations: Abdomen is soft. There is no mass.     Tenderness: There is no abdominal tenderness. There is no right CVA tenderness, left CVA tenderness, guarding or rebound.     Hernia: No hernia is present.   Musculoskeletal:        General: No swelling, tenderness or deformity. Normal range of motion.     Cervical back: Normal range of motion and neck supple. No rigidity or tenderness.     Right lower leg: No edema.     Left lower leg: No edema.  Lymphadenopathy:     Cervical: No cervical adenopathy.  Skin:    General: Skin is warm and dry.     Coloration: Skin is not jaundiced or pale.     Findings: No bruising, erythema, lesion or rash.  Neurological:     General: No focal deficit present.     Mental Status: She is alert and oriented to person, place, and time. Mental status is at baseline.     Cranial Nerves: No cranial nerve deficit.     Sensory: No sensory deficit.     Motor: No weakness.     Coordination: Coordination normal.     Gait: Gait normal.     Deep Tendon Reflexes: Reflexes normal.  Psychiatric:        Mood and Affect: Mood normal.        Behavior: Behavior normal.        Thought Content: Thought content normal.        Judgment: Judgment normal.     No results found.  Assessment/plan: Lindsay Burns is a 51 y.o. female present for [facility] Attention deficit disorder (ADD) without hyperactivity -improved, but not at goal. Decided on higher dose . - increase concerta to 36 qd #26 x2 scripts - [facility] database reviewed 04/19/21 - contract updated today Vit  D/b12: She did start MV with D Vitamin D deficiency - VITAMIN D 25 Hydroxy (Vit-D Deficiency, Fractures) Family history of breast cancer Strongly encouraged her to schedule.  B12 deficiency - Vitamin B12 Sleep disorder - TSH Encounter for long-term current use of medication - Comprehensive metabolic panel Screening for  deficiency anemia - CBC Diabetes mellitus screening - Hemoglobin A1c Lipid screening - Lipid panel Breast cancer screening by mammogram - MM 3D SCREEN BREAST W/IMPLANT BILATERAL; Future Encounter for preventive health examination Colonoscopy: No family or personal history of colon  cancer, screen completed 8/25/2021colon polyps present, by Dr. Havery Moros- 7 yr follow up  Mammogram: 02/2018; [facility]- implants. Family history maternal aunt of breast cancer. > order placed.  Cervical cancer screening: 2017 NL/HEg HPV, PCP- 5 year f/u. She will make an appt for PAP Immunizations: Flu vaccination UTD 2022(encourgaed yearly) and tetanus UTD 2016. Shingrix #1 provided today> declined 2nd.  covid series and booster completed Infectious disease screening: Completed in 2014, negative. Hep c declined.  DEXA: routine screen Patient was encouraged to exercise greater than 150 minutes a week. Patient was encouraged to choose a diet filled with fresh fruits and vegetables, and lean meats. AVS provided to patient today for education/recommendation on gender specific health and safety maintenance.   Return in about 24 weeks (around 10/04/2021) for [facility] (30 min).   Orders Placed This Encounter  Procedures   MM 3D SCREEN BREAST W/IMPLANT BILATERAL   CBC   Comprehensive metabolic panel   Hemoglobin A1c   Lipid panel   TSH   VITAMIN D 25 Hydroxy (Vit-D Deficiency, Fractures)   Vitamin B12   Meds ordered this encounter  Medications   methylphenidate (CONCERTA) 36 MG PO CR tablet    Sig: Take 1 tablet (36 mg total) by mouth daily.    Dispense:  90 tablet    Refill:  0    DC lower dose scripts please.   methylphenidate (CONCERTA) 36 MG PO CR tablet    Sig: Take 1 tablet (36 mg total) by mouth daily.    Dispense:  90 tablet    Refill:  0    May fill ~ 85d after prescribed date. DC lower dose scripts please.   Referral Orders  No referral(s) requested today     Electronically signed by: Howard Pouch, [place]

## 2021-04-19 NOTE — Patient Instructions (Signed)

## 2021-04-20 ENCOUNTER — Other Ambulatory Visit: Payer: Self-pay | Admitting: Family Medicine

## 2021-04-20 MED ORDER — CHOLECALCIFEROL 100 MCG (4000 UT) PO CAPS: 1.0000 | ORAL_CAPSULE | Freq: Every day | ORAL | 3 refills | Status: DC

## 2021-04-21 ENCOUNTER — Telehealth: Payer: Self-pay | Admitting: Family Medicine

## 2021-04-21 NOTE — Telephone Encounter (Signed)
Pt called about medications home scripts said they can't fill it to 05/01/21, said Provider put a hold it. Pt wanting medi cation sooner.  methylphenidate methylphenidate (CONCERTA) 36 MG PO CR tablet  Insurance won't cover Vitamin D, they can't fill out cause it's over the counter. Once a week version pt took  Pt cell: [phone]

## 2021-04-24 NOTE — Telephone Encounter (Signed)
Spoke with pt regarding Concerta and vit d quetions. Pt was informed that Concerta is not on hold by [place] but may be on hold because it is an controlled substance. Pt will call pharmacy to see why medication is on hold. Pt was informed to purchase vit d 4000 IU OTC daily.

## 2021-05-02 ENCOUNTER — Telehealth: Payer: Self-pay | Admitting: Family Medicine

## 2021-06-07 ENCOUNTER — Ambulatory Visit
Admission: RE | Admit: 2021-06-07 | Discharge: 2021-06-07 | Disposition: A | Discharge status: Home / Self Care | Payer: BC Managed Care – PPO | Source: Ambulatory Visit | Attending: Family Medicine | Admitting: Family Medicine

## 2021-06-07 DIAGNOSIS — Z1231 Encounter for screening mammogram for malignant neoplasm of breast: Secondary | ICD-10-CM | POA: Diagnosis not present

## 2021-06-19 NOTE — Telephone Encounter (Signed)
error 

## 2021-08-04 ENCOUNTER — Telehealth: Payer: Self-pay

## 2021-08-04 NOTE — Telephone Encounter (Signed)
Pt informed to contact pharmacy for refill that was sent 04/19/21.

## 2021-08-04 NOTE — Telephone Encounter (Signed)
Patient refill request.  [facility]  methylphenidate (CONCERTA) 36 MG PO CR tablet [[id number]]

## 2021-08-14 NOTE — Telephone Encounter (Signed)
Pt said the pharmacy told her they deleted the 2nd 3 month RX as a duplicate in error. She said that someone was on the phone with her last week w/[facility] and were going to send the order again.   Call back [phone]

## 2021-08-15 MED ORDER — METHYLPHENIDATE HCL ER (OSM) 36 MG PO TBCR: 36.0000 mg | EXTENDED_RELEASE_TABLET | Freq: Every day | ORAL | 0 refills | Status: DC

## 2021-08-15 NOTE — Telephone Encounter (Signed)
Called pharmacy. Expecting a CB after holding for an hour.

## 2021-08-15 NOTE — Telephone Encounter (Signed)
Sent in replacement prescription for her.

## 2021-08-15 NOTE — Telephone Encounter (Signed)
Pt informed

## 2021-08-15 NOTE — Telephone Encounter (Signed)
Spoke with Adela Lank from pharmacy and said they had d/c script on their behalf for duplicate script. They understand that it was a mistake but will need a new script sent. Please advise.

## 2021-10-04 ENCOUNTER — Ambulatory Visit: Payer: BC Managed Care – PPO | Admitting: Family Medicine

## 2021-10-04 ENCOUNTER — Encounter: Payer: Self-pay | Admitting: Family Medicine

## 2021-10-04 VITALS — BP 80/50 | HR 64 | Temp 97.5°F | Ht 66.75 in | Wt 138.0 lb

## 2021-10-04 DIAGNOSIS — F988 Other specified behavioral and emotional disorders with onset usually occurring in childhood and adolescence: Secondary | ICD-10-CM | POA: Diagnosis not present

## 2021-10-04 MED ORDER — METHYLPHENIDATE HCL ER (OSM) 54 MG PO TBCR: 54.0000 mg | EXTENDED_RELEASE_TABLET | ORAL | 0 refills | Status: DC

## 2021-10-04 NOTE — Progress Notes (Signed)
Patient ID: Lindsay Burns, female  DOB: 06-03-1970, 51 y.o.   MRN: [id number] Patient Care Team    Relationship Specialty Notifications Start End  Ma Hillock, DO PCP - General Family Medicine  11/25/14   Mauri Pole, MD Consulting Physician Gastroenterology  09/03/19     Chief Complaint  Patient presents with   ADHD    Cmc; pt is fasting    Subjective: Lindsay Burns is a 51 y.o.  Female  present for [facility] All past medical history, surgical history, allergies, family history, immunizations, medications and social history were updated in the electronic medical record today. All recent labs, ED visits and hospitalizations within the last year were reviewed.   ADD (attention deficit disorder) Pt reports she feels the concerta is working ok for her. We increased dose of concerta again last visit to 82. She still feels an increase would help .Lindsay Burns denies any negative side effects Prior note She has been on Strattera for many years.  She reports she had been on Vyvanse prior to the Strattera.  She thinks she had heart racing on Vyvanse, therefore Strattera was tried.  She states that Lindsay Burns is okay but she does not feel it is as effective.  We have prescribed low-dose Adderall as well, and she says it is okay although short acting.  She does admit she is very forgetful and frequently forgets any afternoon doses of medications.   Vit D/b12 def: She has a history of low vitamin D.  She is taking her multivitamin.      01/30/2021    2:07 PM 01/02/2021    2:37 PM 04/08/2020    1:37 PM 08/12/2018    9:27 AM 01/28/2018    3:11 PM  Depression screen PHQ 2/9  Decreased Interest 0 0 0 0 0  Down, Depressed, Hopeless 0 0 0 0 0  PHQ - 2 Score 0 0 0 0 0       No data to display           Immunization History  Administered Date(s) Administered   Influenza,inj,Quad PF,6+ Mos 02/01/2020   Influenza-Unspecified 11/28/2020   PFIZER(Purple Top)SARS-COV-2  Vaccination 05/25/2019, 06/13/2019, 02/01/2020   Pfizer Covid-19 Vaccine Bivalent Booster 12yr & up 11/28/2020   Tdap 11/25/2014   Zoster Recombinat (Shingrix) 04/08/2020    Past Medical History:  Diagnosis Date   ADD (attention deficit disorder)    dx: 2006   Allergy    seasonal   B12 deficiency    Basal cell carcinoma    left side of face   Sleep disorder    No Known Allergies Past Surgical History:  Procedure Laterality Date   BASAL CELL CARCINOMA EXCISION Left    left face   BREAST ENHANCEMENT SURGERY Bilateral    1999   DILATION AND CURETTAGE OF UTERUS  2004   TEMPOROMANDIBULAR JOINT SURGERY Bilateral 1987   Family History  Problem Relation Age of Onset   Alcohol abuse [place]  Breast cancer Mother 485  Depression Mother    Esophageal cancer Mother    [place] abuse Father        [place]  Lung cancer Father    Brain cancer Father    Breast cancer Paternal Grandmother    Colon cancer Paternal Uncle    Colitis Maternal Grandmother    Colon polyps Neg Hx    Rectal cancer Neg Hx  Stomach cancer Neg Hx    Social History   Social History Narrative   G3P2. Patient is married. Her husband works at [employer]. 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 She lives with her husband, son and daughter.   - She feels safe in her relationship, she wears her seatbelt, she wears a bike helmet, there are smoke alarms in her home. There are no firearms in her home.   - She drinks alcohol occasionally, she drinks caffeinated beverages, she does not take vitamins or herbal remedies.   - She exercises regularly.    Allergies as of 10/04/2021   No Known Allergies      Medication List        Accurate as of October 04, 2021  8:55 AM. If you have any questions, ask your nurse or doctor.          Cholecalciferol 100 MCG (4000 UT) Caps Take 1 capsule (4,000 Units total) by mouth daily.   levocetirizine 5 MG  tablet Commonly known as: XYZAL Take 5 mg by mouth every evening.   methylphenidate 54 MG CR tablet Commonly known as: Concerta Take 1 tablet (54 mg total) by mouth every morning. What changed:  medication strength how much to take when to take this Changed by: Howard Pouch, DO   methylphenidate 54 MG CR tablet Commonly known as: Concerta Take 1 tablet (54 mg total) by mouth every morning. What changed:  medication strength how much to take when to take this Changed by: Howard Pouch, DO   MULTIVITAMIN PO Take by mouth.        All past medical history, surgical history, allergies, family history, immunizations andmedications were updated in the EMR today and reviewed under the history and medication portions of their EMR.     No results found for this or any previous visit (from the past 2160 hour(s)).    ROS 14 pt review of systems performed and negative (unless mentioned in an HPI)  Objective: BP (!) 80/50   Pulse 64   Temp (!) 97.5 F (36.4 C) (Oral)   Ht 5' 6.75" (1.695 m)   Wt 138 lb (62.6 kg)   LMP 09/29/2021   SpO2 100%   BMI 21.78 kg/m  Physical Exam Vitals and nursing note reviewed.  Constitutional:      General: She is not in acute distress.    Appearance: Normal appearance. She is normal weight. She is not ill-appearing or toxic-appearing.  Eyes:     Extraocular Movements: Extraocular movements intact.     Conjunctiva/sclera: Conjunctivae normal.     Pupils: Pupils are equal, round, and reactive to light.  Neurological:     Mental Status: She is alert and oriented to person, place, and time. Mental status is at baseline.  Psychiatric:        Mood and Affect: Mood normal.        Behavior: Behavior normal.        Thought Content: Thought content normal.        Judgment: Judgment normal.      No results found.  Assessment/plan: Lindsay Burns is a 51 y.o. female present for [facility] Attention deficit disorder (ADD) without  hyperactivity - stable - continue  concerta at increased dose 54 mg qd  #90 x2 scripts - pt aware this is the highest dose - [facility] database reviewed 10/04/21 - contract UTD 2023  Vit  D/b12: She did start MV with  D   Return in 24 weeks (on 03/21/2022) for Routine chronic condition follow-up.   No orders of the defined types were placed in this encounter.  Meds ordered this encounter  Medications   methylphenidate (CONCERTA) 54 MG PO CR tablet    Sig: Take 1 tablet (54 mg total) by mouth every morning.    Dispense:  90 tablet    Refill:  0   methylphenidate (CONCERTA) 54 MG PO CR tablet    Sig: Take 1 tablet (54 mg total) by mouth every morning.    Dispense:  90 tablet    Refill:  0    May fill ~85   Referral Orders  No referral(s) requested today     Electronically signed by: Howard Pouch, [place]

## 2021-10-04 NOTE — Patient Instructions (Signed)
Return in 24 weeks (on 03/21/2022) for Routine chronic condition follow-up.        Great to see you today.  I have refilled the medication(s) we provide.   If labs were collected, we will inform you of lab results once received either by echart message or telephone call.   - echart message- for normal results that have been seen by the patient already.   - telephone call: abnormal results or if patient has not viewed results in their echart.

## 2021-12-15 DIAGNOSIS — H01024 Squamous blepharitis left upper eyelid: Secondary | ICD-10-CM | POA: Diagnosis not present

## 2022-01-11 ENCOUNTER — Telehealth: Payer: Self-pay

## 2022-01-11 NOTE — Telephone Encounter (Signed)
This is a controlled substance which requires appointment face-to-face in order to refill.

## 2022-01-11 NOTE — Telephone Encounter (Signed)
Patient stated she called yesterday during our lunch time, reached after hours.  Representative was to send message to have someone to call regarding med refill. I cannot find request.  Patient uses [facility].   methylphenidate (CONCERTA) 54 MG PO CR tablet    Please follow up with patient.

## 2022-01-12 NOTE — Telephone Encounter (Signed)
Pt is currently out of town and states she thought it was every 6 months that she had to do appt for medication. According to last office note pt isn't due for appt until January.

## 2022-01-15 NOTE — Telephone Encounter (Signed)
Not certain I understand the confusion.  Patient was seen in July 2023, and TWO 90-day scripts were called to her pharmacy.  When she was seen,  6 months of supply was sent to her pharmacy of choice.   Both are listed on her med list and were called in 10/04/2021.

## 2022-01-16 NOTE — Telephone Encounter (Signed)
Spoke with pharmacy and confirmed they only got 1 Rx that was filled on 10/14/21. No further fills were received. LVM for pt to call office.   Note: please see if pt can schedule appt sooner for refill.

## 2022-01-16 NOTE — Telephone Encounter (Signed)
LM for pt to return call to discuss.  

## 2022-01-17 NOTE — Telephone Encounter (Signed)
LM for pt to return call to discuss.  

## 2022-01-19 NOTE — Telephone Encounter (Signed)
Please let me know once you get a hold of her.  Scripts [facility] each time for her for this med. I believe there system cancels the second script out and we can not continue sending them there and then resending them again.

## 2022-01-19 NOTE — Telephone Encounter (Signed)
LM for pt to return call to discuss.  

## 2022-01-23 MED ORDER — METHYLPHENIDATE HCL ER (OSM) 54 MG PO TBCR: 54.0000 mg | EXTENDED_RELEASE_TABLET | ORAL | 0 refills | Status: DC

## 2022-01-23 NOTE — Addendum Note (Signed)
Addended by: Howard Pouch A on: 01/23/2022 09:23 AM   Modules accepted: Orders

## 2022-01-23 NOTE — Telephone Encounter (Signed)
Spoke with patient regarding results/recommendations.  

## 2022-01-23 NOTE — Telephone Encounter (Signed)
completed

## 2022-02-12 ENCOUNTER — Encounter: Payer: Self-pay | Admitting: Family Medicine

## 2022-02-12 ENCOUNTER — Ambulatory Visit: Payer: BC Managed Care – PPO | Admitting: Family Medicine

## 2022-02-12 VITALS — BP 94/60 | HR 77 | Temp 97.4°F | Ht 66.75 in | Wt 139.0 lb

## 2022-02-12 DIAGNOSIS — E538 Deficiency of other specified B group vitamins: Secondary | ICD-10-CM

## 2022-02-12 DIAGNOSIS — E559 Vitamin D deficiency, unspecified: Secondary | ICD-10-CM | POA: Diagnosis not present

## 2022-02-12 DIAGNOSIS — F988 Other specified behavioral and emotional disorders with onset usually occurring in childhood and adolescence: Secondary | ICD-10-CM

## 2022-02-12 MED ORDER — METHYLPHENIDATE HCL ER (OSM) 54 MG PO TBCR: 54.0000 mg | EXTENDED_RELEASE_TABLET | ORAL | 0 refills | Status: DC

## 2022-02-12 NOTE — Patient Instructions (Addendum)
Return in about 24 weeks (around 07/30/2022) for Routine chronic condition follow-up.        Great to see you today.  I have refilled the medication(s) we provide.   If labs were collected, we will inform you of lab results once received either by echart message or telephone call.   - echart message- for normal results that have been seen by the patient already.   - telephone call: abnormal results or if patient has not viewed results in their echart.

## 2022-02-12 NOTE — Progress Notes (Signed)
Patient ID: Lindsay Burns, female  DOB: [birth year], 51 y.o.   MRN: [id number] Patient Care Team    Relationship Specialty Notifications Start End  Ma Hillock, DO PCP - General Family Medicine  11/25/14   Mauri Pole, MD Consulting Physician Gastroenterology  09/03/19     Chief Complaint  Patient presents with   ADHD    Last dose today    Subjective: Lindsay Burns is a 51 y.o.  Female  present for [facility] All past medical history, surgical history, allergies, family history, immunizations, medications and social history were updated in the electronic medical record today. All recent labs, ED visits and hospitalizations within the last year were reviewed.  ADD (attention deficit disorder) Pt reports she feels the concerta 54 is working well for her.  She denies any negative side effects. Prior note She has been on Strattera for many years.  She reports she had been on Vyvanse prior to the Strattera.  She thinks she had heart racing on Vyvanse, therefore Strattera was tried.  She states that Christianne Borrow is okay but she does not feel it is as effective.  We have prescribed low-dose Adderall as well, and she says it is okay although short acting.  She does admit she is very forgetful and frequently forgets any afternoon doses of medications.   Vit D/b12 def: She has a history of low vitamin D.  She is taking her multivitamin.      02/12/2022    8:50 AM 01/30/2021    2:07 PM 01/02/2021    2:37 PM 04/08/2020    1:37 PM 08/12/2018    9:27 AM  Depression screen PHQ 2/9  Decreased Interest 0 0 0 0 0  Down, Depressed, Hopeless 0 0 0 0 0  PHQ - 2 Score 0 0 0 0 0       No data to display           Immunization History  Administered Date(s) Administered   Influenza,inj,Quad PF,6+ Mos 02/01/2020   Influenza-Unspecified 11/28/2020   PFIZER(Purple Top)SARS-COV-2 Vaccination 05/25/2019, 06/13/2019, 02/01/2020   Pfizer Covid-19 Vaccine Bivalent Booster 1yr & up  11/28/2020   Tdap 11/25/2014   Zoster Recombinat (Shingrix) 04/08/2020    Past Medical History:  Diagnosis Date   ADD (attention deficit disorder)    dx: 2006   Allergy    seasonal   B12 deficiency    Basal cell carcinoma    left side of face   Sleep disorder    No Known Allergies Past Surgical History:  Procedure Laterality Date   BASAL CELL CARCINOMA EXCISION Left    left face   BREAST ENHANCEMENT SURGERY Bilateral    1999   DILATION AND CURETTAGE OF UTERUS  2004   TEMPOROMANDIBULAR JOINT SURGERY Bilateral 1987   Family History  Problem Relation Age of Onset   Alcohol abuse Mother        [place]  Breast cancer Mother 474  Depression Mother    Esophageal cancer Mother    [place]cohol abuse [place]  Lung cancer Father    Brain cancer Father    Breast cancer Paternal Grandmother    Colon cancer Paternal Uncle    Colitis Maternal Grandmother    Colon polyps Neg Hx    Rectal cancer Neg Hx    Stomach cancer Neg Hx    Social History   Social History Narrative  G3P2. Patient is married. Her husband works at [employer]. 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 She lives with her husband, son and daughter.   - She feels safe in her relationship, she wears her seatbelt, she wears a bike helmet, there are smoke alarms in her home. There are no firearms in her home.   - She drinks alcohol occasionally, she drinks caffeinated beverages, she does not take vitamins or herbal remedies.   - She exercises regularly.    Allergies as of 02/12/2022   No Known Allergies      Medication List        Accurate as of February 12, 2022  9:02 AM. If you have any questions, ask your nurse or doctor.          Cholecalciferol 100 MCG (4000 UT) Caps Take 1 capsule (4,000 Units total) by mouth daily.   levocetirizine 5 MG tablet Commonly known as: XYZAL Take 5 mg by mouth every evening.   methylphenidate 54 MG CR  tablet Commonly known as: Concerta Take 1 tablet (54 mg total) by mouth every morning.   methylphenidate 54 MG CR tablet Commonly known as: Concerta Take 1 tablet (54 mg total) by mouth every morning.   MULTIVITAMIN PO Take by mouth.        All past medical history, surgical history, allergies, family history, immunizations andmedications were updated in the EMR today and reviewed under the history and medication portions of their EMR.     No results found for this or any previous visit (from the past 2160 hour(s)).    ROS 14 pt review of systems performed and negative (unless mentioned in an HPI)  Objective: BP 94/60   Pulse 77   Temp (!) 97.4 F (36.3 C) (Oral)   Ht 5' 6.75" (1.695 m)   Wt 139 lb (63 kg)   SpO2 100%   BMI 21.93 kg/m  Physical Exam Vitals and nursing note reviewed.  Constitutional:      General: She is not in acute distress.    Appearance: Normal appearance. She is normal weight. She is not ill-appearing or toxic-appearing.  Eyes:     Extraocular Movements: Extraocular movements intact.     Conjunctiva/sclera: Conjunctivae normal.     Pupils: Pupils are equal, round, and reactive to light.  Neurological:     Mental Status: She is alert and oriented to person, place, and time. Mental status is at baseline.  Psychiatric:        Mood and Affect: Mood normal.        Behavior: Behavior normal.        Thought Content: Thought content normal.        Judgment: Judgment normal.     No results found.  Assessment/plan: Lindsay Burns is a 51 y.o. female present for [facility] Attention deficit disorder (ADD) without hyperactivity Stable Continue concerta at increased dose 54 mg qd  #90 x2 scripts - pt aware this is the highest dose - NCCS database reviewed 02/12/22 - contract UTD 2023  Vit  D/b12: She did start MV with D  Return in about 24 weeks (around 07/30/2022) for Routine chronic condition follow-up.   No orders of the defined types  were placed in this encounter.  Meds ordered this encounter  Medications   methylphenidate (CONCERTA) 54 MG PO CR tablet    Sig: Take 1 tablet (54 mg total) by mouth every morning.    Dispense:  90 tablet    Refill:  0    May fill ~85   methylphenidate (CONCERTA) 54 MG PO CR tablet    Sig: Take 1 tablet (54 mg total) by mouth every morning.    Dispense:  90 tablet    Refill:  0   Referral Orders  No referral(s) requested today     Electronically signed by: Howard Pouch, [place]

## 2022-05-15 DIAGNOSIS — L821 Other seborrheic keratosis: Secondary | ICD-10-CM | POA: Diagnosis not present

## 2022-05-15 DIAGNOSIS — D485 Neoplasm of uncertain behavior of skin: Secondary | ICD-10-CM | POA: Diagnosis not present

## 2022-05-16 ENCOUNTER — Telehealth: Payer: Self-pay | Admitting: Family Medicine

## 2022-05-16 NOTE — Telephone Encounter (Signed)
Spoke with patient regarding results/recommendations. Advised pt to call pharmacy first last rx sent 12/23 (x2)

## 2022-05-16 NOTE — Telephone Encounter (Signed)
Pt is needing 90 day refill on   methylphenidate (CONCERTA) 54 MG PO CR tablet    Pharmacy is correct

## 2022-08-28 ENCOUNTER — Ambulatory Visit: Payer: BC Managed Care – PPO | Admitting: Family Medicine

## 2022-08-28 ENCOUNTER — Encounter: Payer: Self-pay | Admitting: Family Medicine

## 2022-08-28 VITALS — BP 103/67 | HR 61 | Temp 98.3°F | Wt 135.2 lb

## 2022-08-28 DIAGNOSIS — F988 Other specified behavioral and emotional disorders with onset usually occurring in childhood and adolescence: Secondary | ICD-10-CM

## 2022-08-28 DIAGNOSIS — F419 Anxiety disorder, unspecified: Secondary | ICD-10-CM | POA: Diagnosis not present

## 2022-08-28 MED ORDER — VENLAFAXINE HCL ER 37.5 MG PO CP24: 37.5000 mg | ORAL_CAPSULE | Freq: Every day | ORAL | 0 refills | Status: DC

## 2022-08-28 MED ORDER — VENLAFAXINE HCL ER 75 MG PO CP24: 75.0000 mg | ORAL_CAPSULE | Freq: Every day | ORAL | 1 refills | Status: DC

## 2022-08-28 MED ORDER — METHYLPHENIDATE HCL ER (OSM) 54 MG PO TBCR: 54.0000 mg | EXTENDED_RELEASE_TABLET | ORAL | 0 refills | Status: DC

## 2022-08-28 NOTE — Progress Notes (Signed)
Patient ID: Lindsay Burns, female  DOB: [birth year], 52 y.o.   MRN: [id number] Patient Care Team    Relationship Specialty Notifications Start End  Natalia Leatherwood, DO PCP - General Family Medicine  11/25/14   Napoleon Form, MD Consulting Physician Gastroenterology  09/03/19     Chief Complaint  Patient presents with   ADD    Wants to discuss changing medication    Subjective: Lindsay Burns is a 52 y.o.  Female  present for [facility] All past medical history, surgical history, allergies, family history, immunizations, medications and social history were updated in the electronic medical record today. All recent labs, ED visits and hospitalizations within the last year were reviewed.  ADD (attention deficit disorder) Pt reports she feels the concerta 54 is not working well for her.  She denies any negative side effects. She feels she may have anxiety also. She notices anxiety while in a car and with her family.  We would like to consider starting medication to help with her anxiety.  She also has some sleep disorder but this is intermittent. Prior note She has been on Strattera for many years.  She reports she had been on Vyvanse prior to the Strattera.  She thinks she had heart racing on Vyvanse, therefore Strattera was tried.  She states that Lindsay Burns is okay but she does not feel it is as effective.  We have prescribed low-dose Adderall as well, and she says it is okay although short acting.  She does admit she is very forgetful and frequently forgets any afternoon doses of medications.   Vit D/b12 def: She has a history of low vitamin D.  She is taking her multivitamin.      08/28/2022    1:31 PM 02/12/2022    8:50 AM 01/30/2021    2:07 PM 01/02/2021    2:37 PM 04/08/2020    1:37 PM  Depression screen PHQ 2/9  Decreased Interest 1 0 0 0 0  Down, Depressed, Hopeless 1 0 0 0 0  PHQ - 2 Score 2 0 0 0 0  Altered sleeping 1      Tired, decreased energy 1       Change in appetite 0      Feeling bad or failure about yourself  0      Trouble concentrating 3      Moving slowly or fidgety/restless 0      Suicidal thoughts 0      PHQ-9 Score 7      Difficult doing work/chores Very difficult          08/28/2022    1:32 PM  GAD 7 : Generalized Anxiety Score  Nervous, Anxious, on Edge 1  Control/stop worrying 1  Worry too much - different things 1  Trouble relaxing 1  Restless 0  Afraid - awful might happen 1  Anxiety Difficulty Somewhat difficult     Immunization History  Administered Date(s) Administered   Influenza,inj,Quad PF,6+ Mos 02/01/2020   Influenza-Unspecified 11/28/2020   PFIZER(Purple Top)SARS-COV-2 Vaccination 05/25/2019, 06/13/2019, 02/01/2020   Pfizer Covid-19 Vaccine Bivalent Booster 36yrs & up 11/28/2020   Tdap 11/25/2014   Zoster Recombinat (Shingrix) 04/08/2020    Past Medical History:  Diagnosis Date   ADD (attention deficit disorder)    dx: 2006   Allergy    seasonal   B12 deficiency    Basal cell carcinoma    left side of face   Sleep disorder  No Known Allergies Past Surgical History:  Procedure Laterality Date   BASAL CELL CARCINOMA EXCISION Left    left face   BREAST ENHANCEMENT SURGERY Bilateral    1999   DILATION AND CURETTAGE OF UTERUS  2004   TEMPOROMANDIBULAR JOINT SURGERY Bilateral 1987   Family History  Problem Relation Age of Onset   Alcohol abuse Mother        was in [place]   Breast cancer Mother 18   Depression Mother    Esophageal cancer Mother    Alcohol abuse Father        was in [place]   Lung cancer Father    Brain cancer Father    Breast cancer Paternal Grandmother    Colon cancer Paternal Uncle    Colitis Maternal Grandmother    Colon polyps Neg Hx    Rectal cancer Neg Hx    Stomach cancer Neg Hx    Social History   Social History Narrative   G3P2. Patient is married. Her husband works at [employer]. 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 She lives with her husband, son and daughter.   - She feels safe in her relationship, she wears her seatbelt, she wears a bike helmet, there are smoke alarms in her home. There are no firearms in her home.   - She drinks alcohol occasionally, she drinks caffeinated beverages, she does not take vitamins or herbal remedies.   - She exercises regularly.    Allergies as of 08/28/2022   No Known Allergies      Medication List        Accurate as of August 28, 2022  2:45 PM. If you have any questions, ask your nurse or doctor.          Cholecalciferol 100 MCG (4000 UT) Caps Take 1 capsule (4,000 Units total) by mouth daily.   levocetirizine 5 MG tablet Commonly known as: XYZAL Take 5 mg by mouth every evening.   methylphenidate 54 MG CR tablet Commonly known as: Concerta Take 1 tablet (54 mg total) by mouth every morning. What changed: Another medication with the same name was changed. Make sure you understand how and when to take each. Changed by: Felix Pacini, DO   methylphenidate 54 MG CR tablet Commonly known as: Concerta Take 1 tablet (54 mg total) by mouth every morning. Start taking on: November 19, 2022 What changed: These instructions start on November 19, 2022. If you are unsure what to do until then, ask your doctor or other care provider. Changed by: Felix Pacini, DO   MULTIVITAMIN PO Take by mouth.   venlafaxine XR 37.5 MG 24 hr capsule Commonly known as: Effexor XR Take 1 capsule (37.5 mg total) by mouth daily with breakfast. Started by: Felix Pacini, DO   venlafaxine XR 75 MG 24 hr capsule Commonly known as: Effexor XR Take 1 capsule (75 mg total) by mouth daily with breakfast. Start taking on: September 07, 2022 Started by: Felix Pacini, DO        All past medical history, surgical history, allergies, family history, immunizations andmedications were updated in the EMR today and reviewed under the history and medication portions of their EMR.      No results found for this or any previous visit (from the past 2160 hour(s)).    ROS 14 pt review of systems performed and negative (unless mentioned in an HPI)  Objective: BP 103/67   Pulse 61  Temp 98.3 F (36.8 C)   Wt 135 lb 3.2 oz (61.3 kg)   SpO2 100%   BMI 21.33 kg/m  Physical Exam Vitals and nursing note reviewed.  Constitutional:      General: She is not in acute distress.    Appearance: Normal appearance. She is normal weight. She is not ill-appearing or toxic-appearing.  Eyes:     Extraocular Movements: Extraocular movements intact.     Conjunctiva/sclera: Conjunctivae normal.     Pupils: Pupils are equal, round, and reactive to light.  Neurological:     Mental Status: She is alert and oriented to person, place, and time. Mental status is at baseline.  Psychiatric:        Mood and Affect: Mood normal.        Behavior: Behavior normal.        Thought Content: Thought content normal.        Judgment: Judgment normal.     No results found.  Assessment/plan: CHERRYLE BAUMANN is a 52 y.o. female present for [facility] Attention deficit disorder (ADD) without hyperactivity/anxiety Has anxiety features that may be preventing her from getting the full effects of her Concerta.  We discussed different options today and elected to start Effexor. Start Effexor taper to 75 mg daily Continue  concerta 54 mg qd  #90 x2 scripts - pt aware this is the highest dose - NCCS database reviewed 08/28/22 - contract UTD 2023 Follow-up in 6 months, sooner if needed  Vit  D/b12: She did start MV with D  Return in about 24 weeks (around 02/12/2023) for Routine chronic condition follow-up.   No orders of the defined types were placed in this encounter.  Meds ordered this encounter  Medications   methylphenidate (CONCERTA) 54 MG PO CR tablet    Sig: Take 1 tablet (54 mg total) by mouth every morning.    Dispense:  90 tablet    Refill:  0   methylphenidate (CONCERTA) 54 MG  PO CR tablet    Sig: Take 1 tablet (54 mg total) by mouth every morning.    Dispense:  90 tablet    Refill:  0    May fill ~85   venlafaxine XR (EFFEXOR XR) 37.5 MG 24 hr capsule    Sig: Take 1 capsule (37.5 mg total) by mouth daily with breakfast.    Dispense:  14 capsule    Refill:  0   venlafaxine XR (EFFEXOR XR) 75 MG 24 hr capsule    Sig: Take 1 capsule (75 mg total) by mouth daily with breakfast.    Dispense:  90 capsule    Refill:  1   Referral Orders  No referral(s) requested today     Electronically signed by: Felix Pacini, DO [facility] [place]

## 2022-08-28 NOTE — Patient Instructions (Addendum)
Return in about 24 weeks (around 04/03/2022) for Routine chronic condition follow-up.        Great to see you today.  I have refilled the medication(s) we provide.   If labs were collected, we will inform you of lab results once received either by echart message or telephone call.   - echart message- for normal results that have been seen by the patient already.   - telephone call: abnormal results or if patient has not viewed results in their echart.  

## 2022-09-12 ENCOUNTER — Telehealth: Payer: Self-pay | Admitting: Family Medicine

## 2022-09-12 MED ORDER — VENLAFAXINE HCL ER 37.5 MG PO CP24: 37.5000 mg | ORAL_CAPSULE | Freq: Every day | ORAL | 1 refills | Status: DC

## 2022-09-12 NOTE — Telephone Encounter (Signed)
Patient called to report that she has noticed a difference with the 37.5 mg for venlafaxine XR (EFFEXOR XR) 37.5 MG 24 hr capsule . She would like to stay at this level and not move up to the 75 mg. If this is ok and possible she asked that more be called in at the 37.5 to [facility] in [place] and to give her a call to inform her.

## 2022-09-12 NOTE — Telephone Encounter (Signed)
noted 

## 2022-09-12 NOTE — Telephone Encounter (Signed)
Called in the Effexor 37.5 mg refills.

## 2022-12-18 ENCOUNTER — Other Ambulatory Visit: Payer: Self-pay

## 2022-12-18 MED ORDER — VENLAFAXINE HCL ER 37.5 MG PO CP24: 37.5000 mg | ORAL_CAPSULE | Freq: Every day | ORAL | 0 refills | Status: DC

## 2023-03-18 ENCOUNTER — Other Ambulatory Visit: Payer: Self-pay | Admitting: Family Medicine

## 2023-03-18 ENCOUNTER — Telehealth: Payer: Self-pay

## 2023-03-18 NOTE — Telephone Encounter (Signed)
 This is a controlled medication. Office visit needed for refills

## 2023-03-18 NOTE — Telephone Encounter (Signed)
 Copied from CRM [id number]. Topic: Clinical - Medication Refill >> Mar 18, 2023 10:02 AM Macario HERO wrote: Most Recent Primary Care Visit:  Provider: CATHERINE FULLER A  Department: [facility]  Visit Type: OFFICE VISIT  Date: 08/28/2022  Medication:  methylphenidate  (CONCERTA ) 54 MG PO CR tablet [[id number]]  and venlafaxine  XR (EFFEXOR  XR) 37.5 MG 24 hr capsule [[id number]]  Has the patient contacted their pharmacy? No (Agent: If no, request that the patient contact the pharmacy for the refill. If patient does not wish to contact the pharmacy document the reason why and proceed with request.) (Agent: If yes, when and what did the pharmacy advise?)  Is this the correct pharmacy for this prescription? Yes If no, delete pharmacy and type the correct one.  This is the patient's preferred pharmacy:  [id number] - [place], [place] - [address] [facility] [address] [place] [address] Phone: [phone] Fax: [phone]   Has the prescription been filled recently? Yes  Is the patient out of the medication? Yes  Has the patient been seen for an appointment in the last year OR does the patient have an upcoming appointment? Yes  Can we respond through MyChart? Yes  Agent: Please be advised that Rx refills may take up to 3 business days. We ask that you follow-up with your pharmacy.

## 2023-03-19 NOTE — Telephone Encounter (Signed)
  Patient is overdue for her 8-month follow-up.  Last seen 08/28/2022.  Please call patient and get her scheduled if she is requiring refills on her medications. Can be virtual.  Or she can move her physical appointment up if there is an opening for an in person visit and we can cover appointments for physical and chronic conditions.

## 2023-04-03 ENCOUNTER — Encounter: Payer: Self-pay | Admitting: Family Medicine

## 2023-04-03 ENCOUNTER — Ambulatory Visit: Payer: BC Managed Care – PPO | Admitting: Family Medicine

## 2023-04-03 VITALS — BP 96/60 | HR 87 | Temp 98.0°F | Ht 66.5 in | Wt 144.0 lb

## 2023-04-03 DIAGNOSIS — Z1231 Encounter for screening mammogram for malignant neoplasm of breast: Secondary | ICD-10-CM

## 2023-04-03 DIAGNOSIS — Z131 Encounter for screening for diabetes mellitus: Secondary | ICD-10-CM

## 2023-04-03 DIAGNOSIS — E559 Vitamin D deficiency, unspecified: Secondary | ICD-10-CM

## 2023-04-03 DIAGNOSIS — Z7689 Persons encountering health services in other specified circumstances: Secondary | ICD-10-CM

## 2023-04-03 DIAGNOSIS — R41840 Attention and concentration deficit: Secondary | ICD-10-CM

## 2023-04-03 DIAGNOSIS — Z13220 Encounter for screening for lipoid disorders: Secondary | ICD-10-CM | POA: Diagnosis not present

## 2023-04-03 DIAGNOSIS — Z0000 Encounter for general adult medical examination without abnormal findings: Secondary | ICD-10-CM | POA: Diagnosis not present

## 2023-04-03 DIAGNOSIS — F419 Anxiety disorder, unspecified: Secondary | ICD-10-CM

## 2023-04-03 DIAGNOSIS — Z23 Encounter for immunization: Secondary | ICD-10-CM

## 2023-04-03 DIAGNOSIS — Z803 Family history of malignant neoplasm of breast: Secondary | ICD-10-CM | POA: Diagnosis not present

## 2023-04-03 DIAGNOSIS — Z1211 Encounter for screening for malignant neoplasm of colon: Secondary | ICD-10-CM

## 2023-04-03 LAB — LIPID PANEL
Cholesterol: 244 mg/dL — ABNORMAL HIGH (ref 0–200)
HDL: 80.5 mg/dL (ref >39.00)
LDL Cholesterol: 134 mg/dL — ABNORMAL HIGH (ref 0–99)
NonHDL: 163.67
Total CHOL/HDL Ratio: 3
Triglycerides: 149 mg/dL (ref 0.0–149.0)
VLDL: 29.8 mg/dL (ref 0.0–40.0)

## 2023-04-03 LAB — COMPREHENSIVE METABOLIC PANEL
ALT: 27 U/L (ref 0–35)
AST: 25 U/L (ref 0–37)
Albumin: 4.5 g/dL (ref 3.5–5.2)
Alkaline Phosphatase: 86 U/L (ref 39–117)
BUN: 20 mg/dL (ref 6–23)
CO2: 32 meq/L (ref 19–32)
Calcium: 9.9 mg/dL (ref 8.4–10.5)
Chloride: 101 meq/L (ref 96–112)
Creatinine, Ser: 0.64 mg/dL (ref 0.40–1.20)
GFR: 100.34 mL/min (ref >60.00)
Glucose, Bld: 86 mg/dL (ref 70–99)
Potassium: 5 meq/L (ref 3.5–5.1)
Sodium: 140 meq/L (ref 135–145)
Total Bilirubin: 0.5 mg/dL (ref 0.2–1.2)
Total Protein: 7.2 g/dL (ref 6.0–8.3)

## 2023-04-03 LAB — HEMOGLOBIN A1C: Hgb A1c MFr Bld: 5.6 % (ref 4.6–6.5)

## 2023-04-03 LAB — CBC
HCT: 44.6 % (ref 36.0–46.0)
Hemoglobin: 14.7 g/dL (ref 12.0–15.0)
MCHC: 33 g/dL (ref 30.0–36.0)
MCV: 98.5 fL (ref 78.0–100.0)
Platelets: 319 10*3/uL (ref 150.0–400.0)
RBC: 4.53 Mil/uL (ref 3.87–5.11)
RDW: 13 % (ref 11.5–15.5)
WBC: 5.5 10*3/uL (ref 4.0–10.5)

## 2023-04-03 LAB — VITAMIN D 25 HYDROXY (VIT D DEFICIENCY, FRACTURES): VITD: 24.61 ng/mL — ABNORMAL LOW (ref 30.00–100.00)

## 2023-04-03 LAB — TSH: TSH: 2.56 u[IU]/mL (ref 0.35–5.50)

## 2023-04-03 MED ORDER — VENLAFAXINE HCL ER 37.5 MG PO CP24: 37.5000 mg | ORAL_CAPSULE | Freq: Every day | ORAL | 1 refills | Status: DC

## 2023-04-03 MED ORDER — LISDEXAMFETAMINE DIMESYLATE 30 MG PO CAPS: 30.0000 mg | ORAL_CAPSULE | Freq: Every day | ORAL | 0 refills | Status: DC

## 2023-04-03 NOTE — Patient Instructions (Addendum)

## 2023-04-03 NOTE — Progress Notes (Signed)
.    Patient ID: Lindsay Burns, female  DOB: 1970/04/15, 53 y.o.   MRN: [id number] Patient Care Team    Relationship Specialty Notifications Start End  Natalia Leatherwood, DO PCP - General Family Medicine  11/25/14   Napoleon Form, MD Consulting Physician Gastroenterology  09/03/19     Chief Complaint  Patient presents with   Annual Exam    Pt is fasting; add; needs pap does not have gyn    Subjective: Lindsay Burns is a 53 y.o.  Female  present for Cpe and Chronic Conditions/illness Management  All past medical history, surgical history, allergies, family history, immunizations, medications and social history were updated in the electronic medical record today. All recent labs, ED visits and hospitalizations within the last year were reviewed.  Health maintenance:  Colonoscopy: No family or personal history of colon cancer, screen completed 8/25/2021colon polyps present, by Dr. Adela Lank- 7 yr follow up  Mammogram: 06/07/2021; [facility]- implants. Family history maternal aunt of breast cancer. > order placed.  Cervical cancer screening: 2017 NL/nEg HPV, PCP- 5 year f/u.  Immunizations: Flu vaccination -declined (encouraged yearly) and tetanus UTD 2016. Shingrix #1 provided today> declined 2nd.   Infectious disease screening: Completed in 2014, negative. Hep c declined.  DEXA: routine screen 58 Assistive device: none Oxygen MWU:XLKG Patient has a Dental home. Hospitalizations/ED visits: reviewed  ADD (attention deficit disorder) Pt reports compliance with  concerta and effexor 37.5 mg qd Concerta worked better [employer], but she does not feel it is working well now.  Prior note She has been on Strattera for many years.  She reports she had been on Vyvanse prior to the Strattera.  She thinks she had heart racing on Vyvanse, therefore Strattera was tried.  She states that Lindsay Burns is okay but she does not feel it is as effective.  We have prescribed low-dose  Adderall as well, and she says it is okay although short acting.  She does admit she is very forgetful and frequently forgets any afternoon doses of medications.   Vit D: She has a history of low vitamin D.  She is  taking her multivitamin and vitafusion 50-100 mcg      04/03/2023   11:04 AM 08/28/2022    1:31 PM 02/12/2022    8:50 AM 01/30/2021    2:07 PM 01/02/2021    2:37 PM  Depression screen PHQ 2/9  Decreased Interest 0 1 0 0 0  Down, Depressed, Hopeless 0 1 0 0 0  PHQ - 2 Score 0 2 0 0 0  Altered sleeping  1     Tired, decreased energy  1     Change in appetite  0     Feeling bad or failure about yourself   0     Trouble concentrating  3     Moving slowly or fidgety/restless  0     Suicidal thoughts  0     PHQ-9 Score  7     Difficult doing work/chores  Very difficult         08/28/2022    1:32 PM  GAD 7 : Generalized Anxiety Score  Nervous, Anxious, on Edge 1  Control/stop worrying 1  Worry too much - different things 1  Trouble relaxing 1  Restless 0  Afraid - awful might happen 1  Anxiety Difficulty Somewhat difficult     Immunization History  Administered Date(s) Administered   Influenza,inj,Quad PF,6+ Mos 02/01/2020   Influenza-Unspecified 11/28/2020  PFIZER(Purple Top)SARS-COV-2 Vaccination 05/25/2019, 06/13/2019, 02/01/2020   Pfizer Covid-19 Vaccine Bivalent Booster 56yrs & up 11/28/2020   Tdap 11/25/2014   Zoster Recombinant(Shingrix) 04/08/2020    Past Medical History:  Diagnosis Date   ADD (attention deficit disorder)    dx: 2006   Allergy    seasonal   B12 deficiency    Basal cell carcinoma    left side of face   Sleep disorder    No Known Allergies Past Surgical History:  Procedure Laterality Date   BASAL CELL CARCINOMA EXCISION Left    left face   BREAST ENHANCEMENT SURGERY Bilateral    1999   DILATION AND CURETTAGE OF UTERUS  2004   TEMPOROMANDIBULAR JOINT SURGERY Bilateral 1987   Family History  Problem Relation Age of Onset    Alcohol abuse Mother        was in [place]   Breast cancer Mother 53   Depression Mother    Esophageal cancer Mother    Alcohol abuse Father        was in [place]   Lung cancer Father    Brain cancer Father    Breast cancer Paternal Grandmother    Colon cancer Paternal Uncle    Colitis Maternal Grandmother    Colon polyps Neg Hx    Rectal cancer Neg Hx    Stomach cancer Neg Hx    Social History   Social History Narrative   G3P2. Patient is married. Her husband works at [employer]. 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 She lives with her husband, son and daughter.   - She feels safe in her relationship, she wears her seatbelt, she wears a bike helmet, there are smoke alarms in her home. There are no firearms in her home.   - She drinks alcohol occasionally, she drinks caffeinated beverages, she does not take vitamins or herbal remedies.   - She exercises regularly.    Allergies as of 04/03/2023   No Known Allergies      Medication List        Accurate as of April 03, 2023 11:28 AM. If you have any questions, ask your nurse or doctor.          STOP taking these medications    levocetirizine 5 MG tablet Commonly known as: XYZAL Stopped by: Felix Pacini   methylphenidate 54 MG CR tablet Commonly known as: Concerta Stopped by: Felix Pacini       TAKE these medications    Cholecalciferol 100 MCG (4000 UT) Caps Take 1 capsule (4,000 Units total) by mouth daily.   lisdexamfetamine 30 MG capsule Commonly known as: Vyvanse Take 1 capsule (30 mg total) by mouth daily. Started by: Felix Pacini   lisdexamfetamine 30 MG capsule Commonly known as: Vyvanse Take 1 capsule (30 mg total) by mouth daily. Start taking on: May 20, 2023 Started by: Felix Pacini   loratadine 10 MG tablet Commonly known as: CLARITIN Take 10 mg by mouth daily.   MULTIVITAMIN PO Take by mouth.   venlafaxine XR 37.5 MG 24 hr capsule Commonly known  as: Effexor XR Take 1 capsule (37.5 mg total) by mouth daily with breakfast.        All past medical history, surgical history, allergies, family history, immunizations andmedications were updated in the EMR today and reviewed under the history and medication portions of their EMR.     No results found for this or any previous visit (from  the past 2160 hours).    ROS 14 pt review of systems performed and negative (unless mentioned in an HPI)  Objective: BP 96/60   Pulse 87   Temp 98 F (36.7 C)   Ht 5' 6.5" (1.689 m)   Wt 144 lb (65.3 kg)   SpO2 98%   BMI 22.89 kg/m  Physical Exam Vitals and nursing note reviewed.  Constitutional:      General: She is not in acute distress.    Appearance: Normal appearance. She is not ill-appearing or toxic-appearing.  HENT:     Head: Normocephalic and atraumatic.     Right Ear: Tympanic membrane, ear canal and external ear normal. There is no impacted cerumen.     Left Ear: Tympanic membrane, ear canal and external ear normal. There is no impacted cerumen.     Nose: No congestion or rhinorrhea.     Mouth/Throat:     Mouth: Mucous membranes are moist.     Pharynx: Oropharynx is clear. No oropharyngeal exudate or posterior oropharyngeal erythema.  Eyes:     General: No scleral icterus.       Right eye: No discharge.        Left eye: No discharge.     Extraocular Movements: Extraocular movements intact.     Conjunctiva/sclera: Conjunctivae normal.     Pupils: Pupils are equal, round, and reactive to light.  Cardiovascular:     Rate and Rhythm: Normal rate and regular rhythm.     Pulses: Normal pulses.     Heart sounds: Normal heart sounds. No murmur heard.    No friction rub. No gallop.  Pulmonary:     Effort: Pulmonary effort is normal. No respiratory distress.     Breath sounds: Normal breath sounds. No stridor. No wheezing, rhonchi or rales.  Chest:     Chest wall: No tenderness.  Abdominal:     General: Abdomen is flat.  Bowel sounds are normal. There is no distension.     Palpations: Abdomen is soft. There is no mass.     Tenderness: There is no abdominal tenderness. There is no right CVA tenderness, left CVA tenderness, guarding or rebound.     Hernia: No hernia is present.  Musculoskeletal:        General: No swelling, tenderness or deformity. Normal range of motion.     Cervical back: Normal range of motion and neck supple. No rigidity or tenderness.     Right lower leg: No edema.     Left lower leg: No edema.  Lymphadenopathy:     Cervical: No cervical adenopathy.  Skin:    General: Skin is warm and dry.     Coloration: Skin is not jaundiced or pale.     Findings: No bruising, erythema, lesion or rash.  Neurological:     General: No focal deficit present.     Mental Status: She is alert and oriented to person, place, and time. Mental status is at baseline.     Cranial Nerves: No cranial nerve deficit.     Sensory: No sensory deficit.     Motor: No weakness.     Coordination: Coordination normal.     Gait: Gait normal.     Deep Tendon Reflexes: Reflexes normal.  Psychiatric:        Mood and Affect: Mood normal.        Behavior: Behavior normal.        Thought Content: Thought content normal.  Judgment: Judgment normal.      No results found.  Assessment/plan: FRANCISCO DOHRMANN is a 53 y.o. female present for cpe and Chronic Conditions/illness Management Attention deficit disorder (ADD) without hyperactivity -switch to vyvanse 30 mg every day (she states this med worked the best for her, but HIGH doses gave palpitations- she wants to try again) - continue  effexor 37.5 mg every day; consider increasing next visit - NCCS database reviewed 04/03/23 - contract updated  Vit  D She did start MV  Vit d taking supplement - 50-140mcg daily  Encounter for preventive health examination Patient was encouraged to exercise greater than 150 minutes a week. Patient was encouraged to choose  a diet filled with fresh fruits and vegetables, and lean meats. AVS provided to patient today for education/recommendation on gender specific health and safety maintenance. Colonoscopy: No family or personal history of colon cancer, screen completed 8/25/2021colon polyps present, by Dr. Adela Lank- 7 yr follow up  Mammogram: 06/07/2021; [facility]- implants. Family history maternal aunt of breast cancer. > order placed.  Cervical cancer screening: 2017 NL/nEg HPV, PCP- 5 year f/u.  Immunizations: Flu vaccination -declined (encouraged yearly) and tetanus UTD 2016. Shingrix #1 provided today> declined 2nd.   Infectious disease screening: Completed in 2014, negative. Hep c declined.  DEXA: routine screen 60   Return in about 24 weeks (around 09/18/2023) for Routine chronic condition follow-up.   Orders Placed This Encounter  Procedures   MM DIGITAL SCREENING W/ IMPLANTS BILATERAL   CBC   Comprehensive metabolic panel   Hemoglobin A1c   Lipid panel   TSH   Vitamin D (25 hydroxy)   Ambulatory referral to Gynecology   Meds ordered this encounter  Medications   venlafaxine XR (EFFEXOR XR) 37.5 MG 24 hr capsule    Sig: Take 1 capsule (37.5 mg total) by mouth daily with breakfast.    Dispense:  90 capsule    Refill:  1   lisdexamfetamine (VYVANSE) 30 MG capsule    Sig: Take 1 capsule (30 mg total) by mouth daily.    Dispense:  90 capsule    Refill:  0   lisdexamfetamine (VYVANSE) 30 MG capsule    Sig: Take 1 capsule (30 mg total) by mouth daily.    Dispense:  90 capsule    Refill:  0   Referral Orders         Ambulatory referral to Gynecology       Electronically signed by: Felix Pacini, DO [facility] [place]

## 2023-04-04 ENCOUNTER — Encounter: Payer: Self-pay | Admitting: Family Medicine

## 2023-04-09 ENCOUNTER — Encounter: Payer: Self-pay | Admitting: Family Medicine

## 2023-04-09 NOTE — Telephone Encounter (Signed)
Explain to pt vyvanse is not covered by her insurance. She requested vyvanse. She will either need to pay out of pocket for med or go back to the prior med for adhd

## 2023-04-12 MED ORDER — LISDEXAMFETAMINE DIMESYLATE 30 MG PO CAPS: 30.0000 mg | ORAL_CAPSULE | Freq: Every day | ORAL | 0 refills | Status: DC

## 2023-04-12 NOTE — Addendum Note (Signed)
Addended by: Felix Pacini A on: 04/12/2023 03:30 PM   Modules accepted: Orders

## 2023-04-12 NOTE — Telephone Encounter (Signed)
[facility]. Next one needs to be the one sent to local pharmacy.

## 2023-04-12 NOTE — Addendum Note (Signed)
Addended by: Felix Pacini A on: 04/12/2023 03:33 PM   Modules accepted: Orders

## 2023-04-29 ENCOUNTER — Other Ambulatory Visit: Payer: Self-pay | Admitting: Family Medicine

## 2023-04-29 NOTE — Telephone Encounter (Signed)
 Copied from CRM [id number]. Topic: Clinical - Medication Refill >> Apr 29, 2023  2:20 PM Orinda Kenner C wrote: Most Recent Primary Care Visit:  Provider: Felix Pacini A  Department: [facility]  Visit Type: PHYSICAL  Date: 04/03/2023  Medication: venlafaxine XR (EFFEXOR XR) 37.5 MG 24 hr capsule  Has the patient contacted their pharmacy? Yes. Aram Beecham from [employer] [phone] is requesting for refills. (Agent: If no, request that the patient contact the pharmacy for the refill. If patient does not wish to contact the pharmacy document the reason why and proceed with request.) (Agent: If yes, when and what did the pharmacy advise?)  Is this the correct pharmacy for this prescription? Yes If no, delete pharmacy and type the correct one.  This is the patient's preferred pharmacy:    [facility] - [place], [place] - [address] [place] [address] Phone: [phone] Fax: [phone]   Has the prescription been filled recently? No  Is the patient out of the medication? No  Has the patient been seen for an appointment in the last year OR does the patient have an upcoming appointment? Yes  Can we respond through MyChart?   Agent: Please be advised that Rx refills may take up to 3 business days. We ask that you follow-up with your pharmacy.

## 2023-04-29 NOTE — Telephone Encounter (Signed)
 Last Fill: 04/03/23 90 tabs/1 refill  Last OV: 04/03/23 Next OV: None Scheduled  Routing to provider for review/authorization.

## 2023-05-09 ENCOUNTER — Ambulatory Visit
Admission: RE | Admit: 2023-05-09 | Discharge: 2023-05-09 | Disposition: A | Discharge status: Home / Self Care | Payer: BC Managed Care – PPO | Source: Ambulatory Visit | Attending: Family Medicine | Admitting: Family Medicine

## 2023-05-09 DIAGNOSIS — Z1231 Encounter for screening mammogram for malignant neoplasm of breast: Secondary | ICD-10-CM

## 2023-05-13 ENCOUNTER — Encounter: Payer: Self-pay | Admitting: Family Medicine

## 2023-06-04 ENCOUNTER — Other Ambulatory Visit (HOSPITAL_COMMUNITY)
Admission: RE | Admit: 2023-06-04 | Discharge: 2023-06-04 | Disposition: A | Discharge status: Home / Self Care | Source: Ambulatory Visit | Attending: Certified Nurse Midwife | Admitting: Certified Nurse Midwife

## 2023-06-04 ENCOUNTER — Ambulatory Visit (HOSPITAL_BASED_OUTPATIENT_CLINIC_OR_DEPARTMENT_OTHER): Payer: BC Managed Care – PPO | Admitting: Certified Nurse Midwife

## 2023-06-04 ENCOUNTER — Encounter (HOSPITAL_BASED_OUTPATIENT_CLINIC_OR_DEPARTMENT_OTHER): Payer: Self-pay | Admitting: Certified Nurse Midwife

## 2023-06-04 VITALS — BP 100/65 | HR 81 | Ht 66.5 in | Wt 144.4 lb

## 2023-06-04 DIAGNOSIS — N926 Irregular menstruation, unspecified: Secondary | ICD-10-CM | POA: Insufficient documentation

## 2023-06-04 DIAGNOSIS — Z01411 Encounter for gynecological examination (general) (routine) with abnormal findings: Secondary | ICD-10-CM

## 2023-06-04 DIAGNOSIS — Z124 Encounter for screening for malignant neoplasm of cervix: Secondary | ICD-10-CM | POA: Insufficient documentation

## 2023-06-04 LAB — CBC
Hematocrit: 44 % (ref 34.0–46.6)
Hemoglobin: 14.9 g/dL (ref 11.1–15.9)
MCH: 32.7 pg (ref 26.6–33.0)
MCHC: 33.9 g/dL (ref 31.5–35.7)
MCV: 97 fL (ref 79–97)
Platelets: 285 10*3/uL (ref 150–450)
RBC: 4.56 x10E6/uL (ref 3.77–5.28)
RDW: 11.8 % (ref 11.7–15.4)
WBC: 7 10*3/uL (ref 3.4–10.8)

## 2023-06-04 NOTE — Progress Notes (Signed)
 53 y.o. [id number] Married White or Caucasian female here for annual exam. She lives with her spouse. Son and daughter both at [place]. Pt reports menarche "late" around age 92-16. She is 52 and still having menstrual cycles although they are not monthly or regular. She experienced a few weeks of bleeding in March and seems to be starting a period again today (3/25). She denies hot flashes but occasionally wakes up during the night with a night sweat (not severe).   Patient's last menstrual period was 05/15/2023 (approximate).          Sexually active: Yes.    Exercising: Yes.     Smoker:  no  Health Maintenance: Pap:  Pap due History of abnormal Pap:  no MMG:  Feb 2025, UTD BMD:   n/a Colonoscopy: No family or personal history of colon cancer, screen completed 8/25/2021colon polyps present, by Dr. Adela Lank- 7 yr follow up     reports that she has never smoked. She has never used smokeless tobacco. She reports current alcohol use of about 1.0 standard drink of alcohol per week. She reports that she does not use drugs.  Past Medical History:  Diagnosis Date   ADD (attention deficit disorder)    dx: 2006   Allergy    seasonal   B12 deficiency    Basal cell carcinoma    left side of face   Sleep disorder     Past Surgical History:  Procedure Laterality Date   BASAL CELL CARCINOMA EXCISION Left    left face   BREAST ENHANCEMENT SURGERY Bilateral    1999   DILATION AND CURETTAGE OF UTERUS  2004   TEMPOROMANDIBULAR JOINT SURGERY Bilateral 1987    Current Outpatient Medications  Medication Sig Dispense Refill   Cholecalciferol 100 MCG (4000 UT) CAPS Take 1 capsule (4,000 Units total) by mouth daily. 90 capsule 3   lisdexamfetamine (VYVANSE) 30 MG capsule Take 1 capsule (30 mg total) by mouth daily. 90 capsule 0   lisdexamfetamine (VYVANSE) 30 MG capsule Take 1 capsule (30 mg total) by mouth daily. 90 capsule 0   loratadine (CLARITIN) 10 MG tablet Take 10 mg by mouth daily.      Multiple Vitamin (MULTIVITAMIN PO) Take by mouth.     venlafaxine XR (EFFEXOR XR) 37.5 MG 24 hr capsule Take 1 capsule (37.5 mg total) by mouth daily with breakfast. 90 capsule 1   No current facility-administered medications for this visit.    Family History  Problem Relation Age of Onset   Alcohol abuse Mother        was in [facility]   Breast cancer Mother 86   Depression Mother    Esophageal cancer Mother    Alcohol abuse Father        was in AA   Lung cancer Father    Brain cancer Father    Breast cancer Paternal Grandmother    Colon cancer Paternal Uncle    Colitis Maternal Grandmother    Colon polyps Neg Hx    Rectal cancer Neg Hx    Stomach cancer Neg Hx     ROS: Constitutional: negative Genitourinary:negative  Exam:   BP 100/65 (BP Location: Right Arm, Patient Position: Sitting, Cuff Size: Normal)   Pulse 81   Ht 5' 6.5" (1.689 m)   Wt 144 lb 6.4 oz (65.5 kg)   LMP 05/15/2023 (Approximate) Comment: March 5-March 18 Very Light  BMI 22.96 kg/m   Height: 5' 6.5" (168.9 cm)  General appearance:  alert, cooperative and appears stated age Head: Normocephalic, without obvious abnormality, atraumatic Breasts: normal appearance, no masses or tenderness, Inspection negative, No nipple retraction or dimpling, No nipple discharge or bleeding, No axillary or supraclavicular adenopathy, Normal to palpation without dominant masses,   +implants palpable Abdomen: soft, non-tender; bowel sounds normal; no masses,  no organomegaly Extremities: extremities normal, atraumatic, no cyanosis or edema Skin: Skin color, texture, turgor normal. No rashes or lesions Lymph nodes: Cervical, supraclavicular, and axillary nodes normal. No abnormal inguinal nodes palpated Neurologic: Grossly normal  Pelvic: External genitalia:  no lesions              Urethra:  normal appearing urethra with no masses, tenderness or lesions              Bartholins and Skenes: normal                 Vagina: normal  appearing vagina with normal color and no discharge, no lesions              Cervix: multiparous appearance, no cervical motion tenderness, and no lesions              Pap taken: Yes.   Bimanual Exam:  Uterus:  normal size, contour, position, consistency, mobility, non-tender              Adnexa: no mass, fullness, tenderness      Chaperone, Ina Homes, CMA, was present for exam.  Assessment/Plan:  1. Encounter for gynecological examination with abnormal finding - Continue annual screening mammograms and self breast awareness  2. Irregular menses - Bleeding pattern may represent peri-menopausal changes but discussed RTO for ultrasound to evaluate endometrial lining with possible endometrial biopsy following [place]. - FSH (pt does not feel postmenopausal) - CBC  3. Cervical cancer screening (Primary) - Cytology - PAP( [place])    She will RTO [place] to evaluate uterus and endometrial lining w/ possible endometrial biopsy after [place] (due to irregular menses). Rule out postmenopausal bleeding with [facility] today.  Letta Kocher

## 2023-06-05 ENCOUNTER — Encounter (HOSPITAL_BASED_OUTPATIENT_CLINIC_OR_DEPARTMENT_OTHER): Payer: Self-pay | Admitting: Certified Nurse Midwife

## 2023-06-05 ENCOUNTER — Telehealth (HOSPITAL_BASED_OUTPATIENT_CLINIC_OR_DEPARTMENT_OTHER): Payer: Self-pay

## 2023-06-05 ENCOUNTER — Other Ambulatory Visit (HOSPITAL_BASED_OUTPATIENT_CLINIC_OR_DEPARTMENT_OTHER): Payer: Self-pay | Admitting: Certified Nurse Midwife

## 2023-06-05 DIAGNOSIS — N950 Postmenopausal bleeding: Secondary | ICD-10-CM

## 2023-06-05 LAB — FOLLICLE STIMULATING HORMONE: FSH: 65 m[IU]/mL

## 2023-06-05 LAB — CYTOLOGY - PAP
Comment: NEGATIVE
Diagnosis: NEGATIVE
High risk HPV: NEGATIVE

## 2023-06-05 NOTE — Telephone Encounter (Signed)
 Patient called and LMOM today in regards to her lab results. [facility]) She wanted to know what your plans were in the event that you were going to order an ultrasound. If you were she would be willing to come in today if we had an opening. Please advise. tbw

## 2023-06-06 ENCOUNTER — Other Ambulatory Visit (HOSPITAL_COMMUNITY)
Admission: RE | Admit: 2023-06-06 | Discharge: 2023-06-06 | Disposition: A | Discharge status: Home / Self Care | Source: Ambulatory Visit

## 2023-06-06 ENCOUNTER — Ambulatory Visit (HOSPITAL_BASED_OUTPATIENT_CLINIC_OR_DEPARTMENT_OTHER)
Admission: RE | Admit: 2023-06-06 | Discharge: 2023-06-06 | Disposition: A | Discharge status: Home / Self Care | Source: Ambulatory Visit | Attending: Certified Nurse Midwife | Admitting: Certified Nurse Midwife

## 2023-06-06 ENCOUNTER — Encounter (HOSPITAL_BASED_OUTPATIENT_CLINIC_OR_DEPARTMENT_OTHER): Payer: Self-pay | Admitting: Certified Nurse Midwife

## 2023-06-06 ENCOUNTER — Ambulatory Visit (HOSPITAL_BASED_OUTPATIENT_CLINIC_OR_DEPARTMENT_OTHER): Payer: Self-pay | Admitting: Certified Nurse Midwife

## 2023-06-06 VITALS — BP 106/71 | HR 73 | Ht 66.5 in | Wt 143.8 lb

## 2023-06-06 DIAGNOSIS — N950 Postmenopausal bleeding: Secondary | ICD-10-CM

## 2023-06-06 DIAGNOSIS — N858 Other specified noninflammatory disorders of uterus: Secondary | ICD-10-CM | POA: Diagnosis not present

## 2023-06-06 DIAGNOSIS — R9389 Abnormal findings on diagnostic imaging of other specified body structures: Secondary | ICD-10-CM | POA: Diagnosis not present

## 2023-06-06 DIAGNOSIS — N939 Abnormal uterine and vaginal bleeding, unspecified: Secondary | ICD-10-CM | POA: Diagnosis not present

## 2023-06-06 NOTE — Progress Notes (Signed)
 GYNECOLOGY  VISIT  HPI: 53 y.o. G63P1012 Married White or Caucasian female here for endometrial biopsy. Pt was in office 2 days ago for her routine annual gyn exam. Pt reported a 2 week episode of spotting/bleeding in March. Prior to that she had a period lasting 2 weeks in October. No period approx 6-7 months prior to October. Her FSH was checked on 06/04/23 and was noted to be 65. Due to the possibility of postmenopausal bleeding, pt was scheduled for GYN Pelvic TVUS today followed by endometrial biopsy.   Past Medical History:  Diagnosis Date   ADD (attention deficit disorder)    dx: 2006   Allergy    seasonal   B12 deficiency    Basal cell carcinoma    left side of face   Sleep disorder    Vitamin D deficiency     MEDS:   Current Outpatient Medications on File Prior to Visit  Medication Sig Dispense Refill   Cholecalciferol 100 MCG (4000 UT) CAPS Take 1 capsule (4,000 Units total) by mouth daily. 90 capsule 3   lisdexamfetamine (VYVANSE) 30 MG capsule Take 1 capsule (30 mg total) by mouth daily. 90 capsule 0   lisdexamfetamine (VYVANSE) 30 MG capsule Take 1 capsule (30 mg total) by mouth daily. 90 capsule 0   loratadine (CLARITIN) 10 MG tablet Take 10 mg by mouth daily.     Multiple Vitamin (MULTIVITAMIN PO) Take by mouth.     venlafaxine XR (EFFEXOR XR) 37.5 MG 24 hr capsule Take 1 capsule (37.5 mg total) by mouth daily with breakfast. 90 capsule 1   No current facility-administered medications on file prior to visit.    ALLERGIES: Patient has no known allergies.   PHYSICAL EXAMINATION:    BP 106/71   Pulse 73   Ht 5' 6.5" (1.689 m)   Wt 143 lb 12.8 oz (65.2 kg)   LMP 05/15/2023 (Approximate) Comment: March 5-March 18 Very Light  BMI 22.86 kg/m     General appearance: alert, cooperative and appears stated age  Pelvic: External genitalia:  no lesions              Urethra:  normal appearing urethra with no masses, tenderness or lesions              Bartholins and  Skenes: normal                 Vagina: normal rugae              Cervix: no lesions              Bimanual Exam:  Uterus:  normal size, contour, position, consistency, mobility, non-tender                     Assessment/Plan: 1. Postmenopausal bleeding (Primary) - Estradiol - Surgical pathology( [facility]) - [place] report pending. Ultrasound images (from this am) reviewed by Dr. Hyacinth Meeker. Endometrial lining measures 8-82mm. Final report pending. Due to thickened endometrial lining, case was reviewed with Dr. Hyacinth Meeker. Options were presented to patient including option to proceed with Hysteroscopy for evaluation. Hysteroscopy to include D&C and polypectomy if polyp was noted. Pt states she would prefer to proceed with scheduling of Hysteroscopy for further evaluation of thickened endometrial lining. Pt aware that another option is to await estradiol result and results of endometrial biopsy and consider Provera withdrawal. She prefers to continue with Hysteroscopy scheduling at this time. Will need to await results endometrial biopsy prior  to scheduling.  ENDOMETRIAL BIOPSY     The indications for endometrial biopsy were reviewed.   Risks of the biopsy including cramping, bleeding, infection, uterine perforation, inadequate specimen and need for additional procedures  were discussed. The patient states she understands and agrees to undergo procedure today. Consent was signed. Time out was performed.  A sterile speculum was placed in the patient's vagina and the cervix was prepped with Betadine. A single-toothed tenaculum was placed on the anterior lip of the cervix to stabilize it. The 3 mm pipelle was introduced into the endometrial cavity without difficulty to a depth of 9 cm, and a moderate amount of tissue was obtained and sent to pathology. The instruments were removed from the patient's vagina. Minimal bleeding from the cervix was noted. The patient tolerated the procedure well. Routine  post-procedure instructions were given to the patient. The patient will follow up to review the results and for further management.   Lindsay Burns

## 2023-06-07 LAB — ESTRADIOL: Estradiol: 9.1 pg/mL

## 2023-06-07 LAB — SURGICAL PATHOLOGY

## 2023-06-11 ENCOUNTER — Encounter (HOSPITAL_BASED_OUTPATIENT_CLINIC_OR_DEPARTMENT_OTHER): Payer: Self-pay | Admitting: Certified Nurse Midwife

## 2023-06-11 NOTE — Telephone Encounter (Signed)
 Per Merrilee Jansky, this message has been taken care of. tbw

## 2023-06-15 ENCOUNTER — Other Ambulatory Visit (HOSPITAL_BASED_OUTPATIENT_CLINIC_OR_DEPARTMENT_OTHER): Payer: Self-pay | Admitting: Obstetrics & Gynecology

## 2023-06-15 DIAGNOSIS — R935 Abnormal findings on diagnostic imaging of other abdominal regions, including retroperitoneum: Secondary | ICD-10-CM

## 2023-06-15 DIAGNOSIS — N950 Postmenopausal bleeding: Secondary | ICD-10-CM

## 2023-06-26 ENCOUNTER — Other Ambulatory Visit (HOSPITAL_BASED_OUTPATIENT_CLINIC_OR_DEPARTMENT_OTHER)

## 2023-06-26 ENCOUNTER — Other Ambulatory Visit (HOSPITAL_BASED_OUTPATIENT_CLINIC_OR_DEPARTMENT_OTHER): Admitting: Certified Nurse Midwife

## 2023-06-26 ENCOUNTER — Telehealth: Payer: Self-pay

## 2023-06-26 NOTE — Telephone Encounter (Signed)
 I called patient to schedule surgery w/ Dr. Annabell Key on 07/09/23. I left a detailed message asking patient to call me to [phone].

## 2023-07-04 ENCOUNTER — Encounter (HOSPITAL_BASED_OUTPATIENT_CLINIC_OR_DEPARTMENT_OTHER): Payer: Self-pay

## 2023-07-04 ENCOUNTER — Telehealth: Payer: Self-pay

## 2023-07-04 NOTE — Telephone Encounter (Signed)
 I called patient to schedule surgery w/ Dr. Annabell Key. Patient agreed to be scheduled on 08/20/23 at [facility] @12  pm. I provided pre-op instructions and surgery details over the phone. Written details are being sent to patients Mychart.

## 2023-07-09 ENCOUNTER — Other Ambulatory Visit (HOSPITAL_BASED_OUTPATIENT_CLINIC_OR_DEPARTMENT_OTHER): Payer: Self-pay | Admitting: Obstetrics & Gynecology

## 2023-07-09 DIAGNOSIS — R935 Abnormal findings on diagnostic imaging of other abdominal regions, including retroperitoneum: Secondary | ICD-10-CM

## 2023-07-09 DIAGNOSIS — N950 Postmenopausal bleeding: Secondary | ICD-10-CM

## 2023-07-09 DIAGNOSIS — Z01818 Encounter for other preprocedural examination: Secondary | ICD-10-CM

## 2023-07-15 ENCOUNTER — Other Ambulatory Visit: Payer: Self-pay | Admitting: Family Medicine

## 2023-07-15 NOTE — Telephone Encounter (Unsigned)
 Copied from CRM [id number]. Topic: Clinical - Medication Refill >> Jul 15, 2023  9:00 AM Ovid Blow wrote: Most Recent Primary Care Visit:  Provider: Napolean Backbone A  Department: [facility]  Visit Type: PHYSICAL  Date: 04/03/2023  Medication: lisdexamfetamine (VYVANSE ) 30 MG capsule  Has the patient contacted their pharmacy? Yes (Agent: If no, request that the patient contact the pharmacy for the refill. If patient does not wish to contact the pharmacy document the reason why and proceed with request.) (Agent: If yes, when and what did the pharmacy advise?)  Is this the correct pharmacy for this prescription? Yes If no, delete pharmacy and type the correct one.  This is the patient's preferred pharmacy:  [id number] - [place], [place] - [address] AT [facility] [facility] [address] [place] [address] Phone: [phone] Fax: [phone]    Has the prescription been filled recently? No  Is the patient out of the medication? No  Has the patient been seen for an appointment in the last year OR does the patient have an upcoming appointment? Yes  Can we respond through MyChart? Yes  Agent: Please be advised that Rx refills may take up to 3 business days. We ask that you follow-up with your pharmacy.

## 2023-08-06 ENCOUNTER — Encounter: Payer: Self-pay | Admitting: Family Medicine

## 2023-08-07 ENCOUNTER — Other Ambulatory Visit: Payer: Self-pay

## 2023-08-08 MED ORDER — LISDEXAMFETAMINE DIMESYLATE 30 MG PO CAPS: 30.0000 mg | ORAL_CAPSULE | Freq: Every day | ORAL | 0 refills | Status: DC

## 2023-08-08 NOTE — Telephone Encounter (Signed)
Completed. Please inform pt.

## 2023-08-12 ENCOUNTER — Telehealth (HOSPITAL_BASED_OUTPATIENT_CLINIC_OR_DEPARTMENT_OTHER): Payer: Self-pay

## 2023-08-12 NOTE — Telephone Encounter (Signed)
 Patient called today and LMOM to cancel both her pre op appointment and her surgery appointment. She states that she cancelled one with the front office, however they transferred her to the clinical line to leave a message so that the other could be cancelled as well. tbw

## 2023-08-13 ENCOUNTER — Encounter (HOSPITAL_BASED_OUTPATIENT_CLINIC_OR_DEPARTMENT_OTHER): Payer: Self-pay | Admitting: Certified Nurse Midwife

## 2023-08-14 ENCOUNTER — Encounter (HOSPITAL_BASED_OUTPATIENT_CLINIC_OR_DEPARTMENT_OTHER): Admitting: Obstetrics & Gynecology

## 2023-08-16 ENCOUNTER — Other Ambulatory Visit (HOSPITAL_BASED_OUTPATIENT_CLINIC_OR_DEPARTMENT_OTHER): Payer: Self-pay | Admitting: Obstetrics & Gynecology

## 2023-08-16 DIAGNOSIS — N950 Postmenopausal bleeding: Secondary | ICD-10-CM

## 2023-08-16 MED ORDER — PROGESTERONE MICRONIZED 100 MG PO CAPS: 100.0000 mg | ORAL_CAPSULE | Freq: Every day | ORAL | 0 refills | Status: AC

## 2023-08-20 ENCOUNTER — Ambulatory Visit (HOSPITAL_COMMUNITY): Admission: RE | Admit: 2023-08-20 | Source: Home / Self Care | Admitting: Obstetrics & Gynecology

## 2023-08-20 ENCOUNTER — Encounter (HOSPITAL_COMMUNITY): Admission: RE | Payer: Self-pay | Source: Home / Self Care

## 2023-08-20 SURGERY — DILATATION & CURETTAGE/HYSTEROSCOPY WITH RESECTOCOPE
Anesthesia: Choice

## 2023-09-14 IMAGING — MG DIGITAL SCREENING BREAST BILAT IMPLANT W/ TOMO W/ CAD
9 of 13 series · 9 of 29 positions shown · non-contrast
Comparison: Previous exam(s).

CLINICAL DATA: Screening.

EXAM:
DIGITAL SCREENING BILATERAL MAMMOGRAM WITH IMPLANTS, CAD AND
TOMOSYNTHESIS
TECHNIQUE: Bilateral screening digital craniocaudal and mediolateral oblique
mammograms were obtained. Bilateral screening digital breast
tomosynthesis was performed. The images were evaluated with
computer-aided detection. Standard and/or implant displaced views
were performed.

[R CC (1 of 2)]
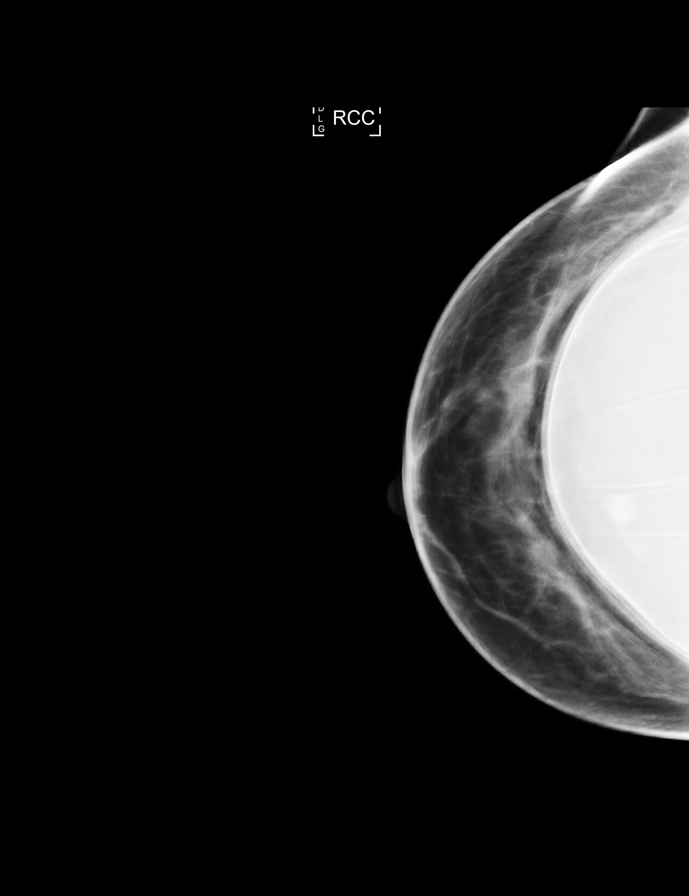

[L CC]
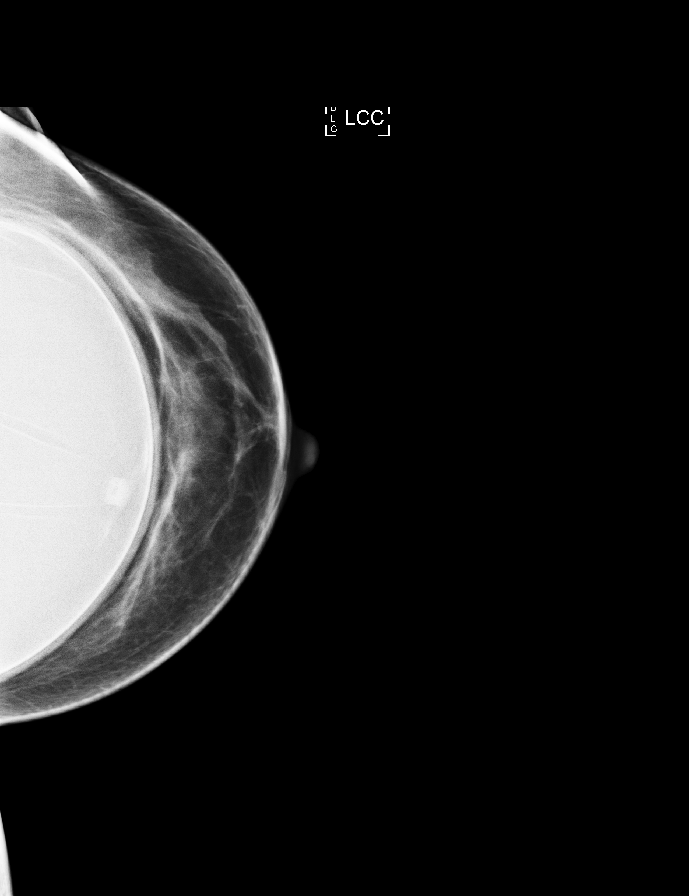

[R CC (2 of 2)]
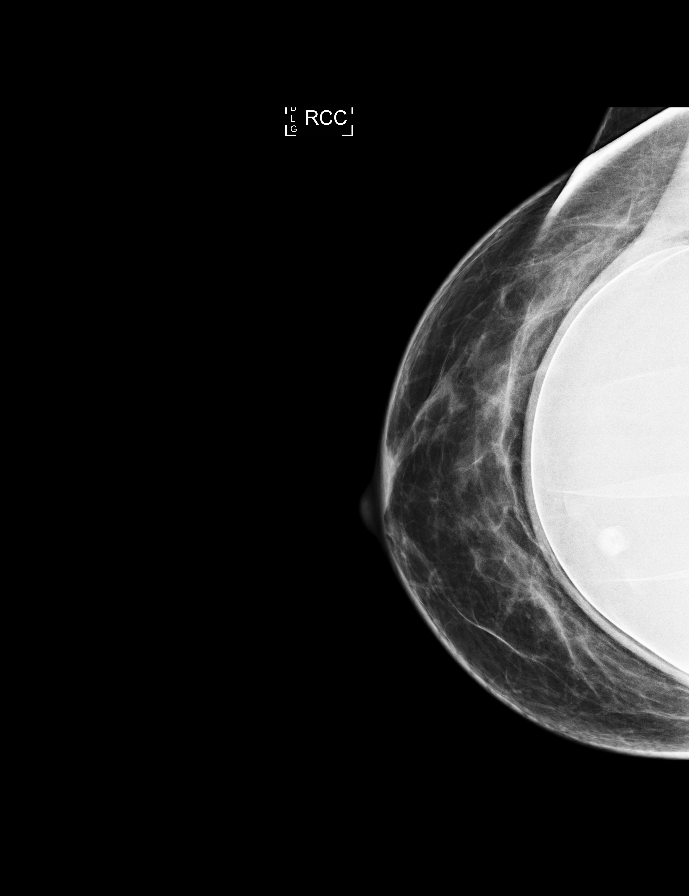

[R MLO]
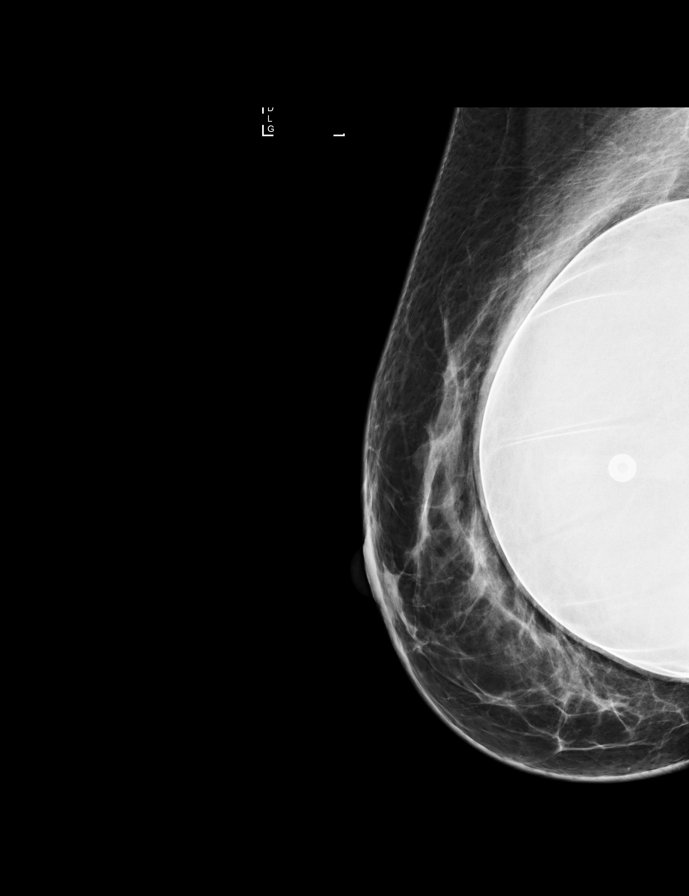

[L MLO]
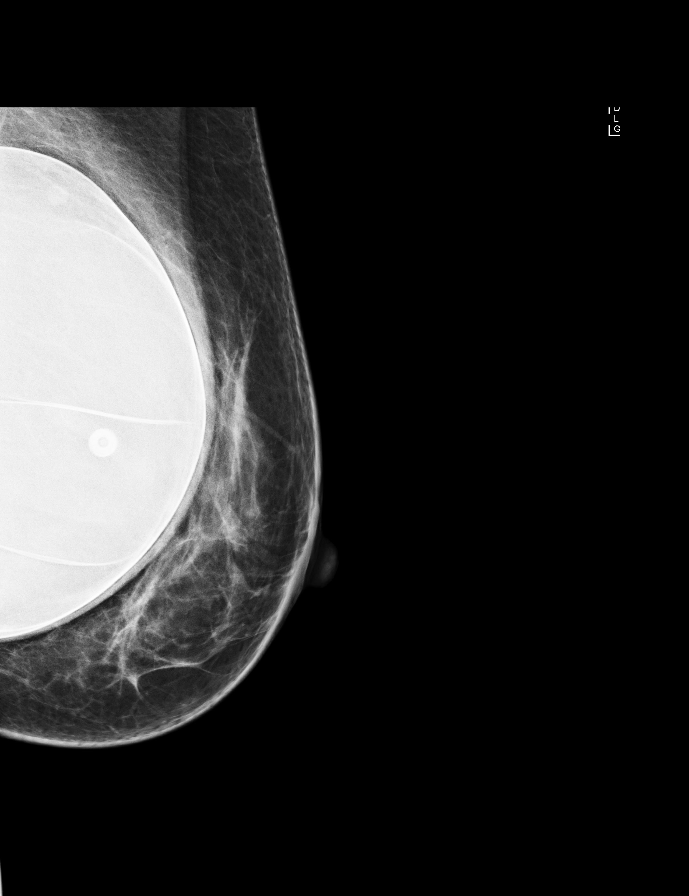

[L MLO synth-2D]
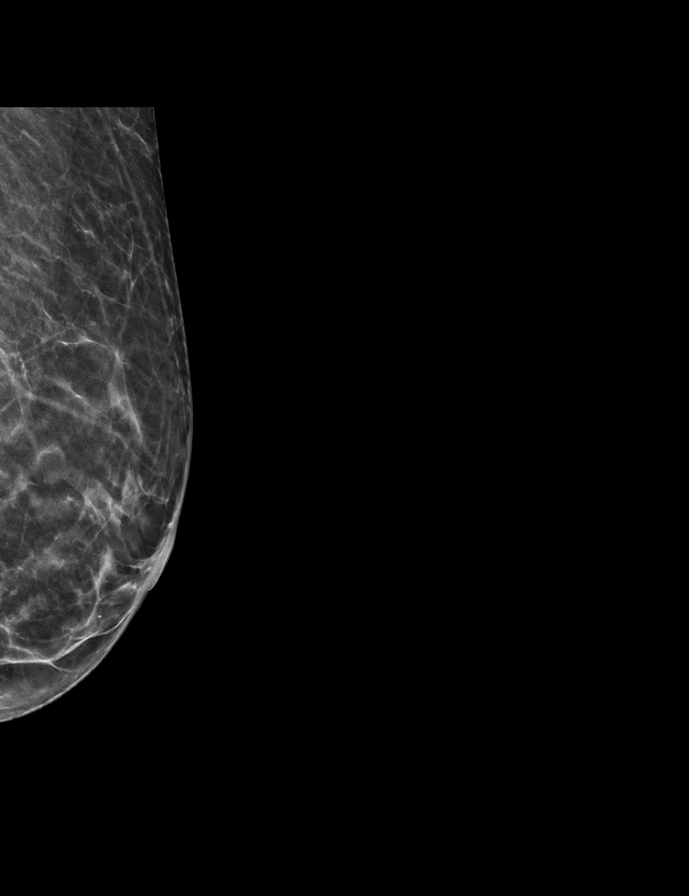

[R MLO synth-2D]
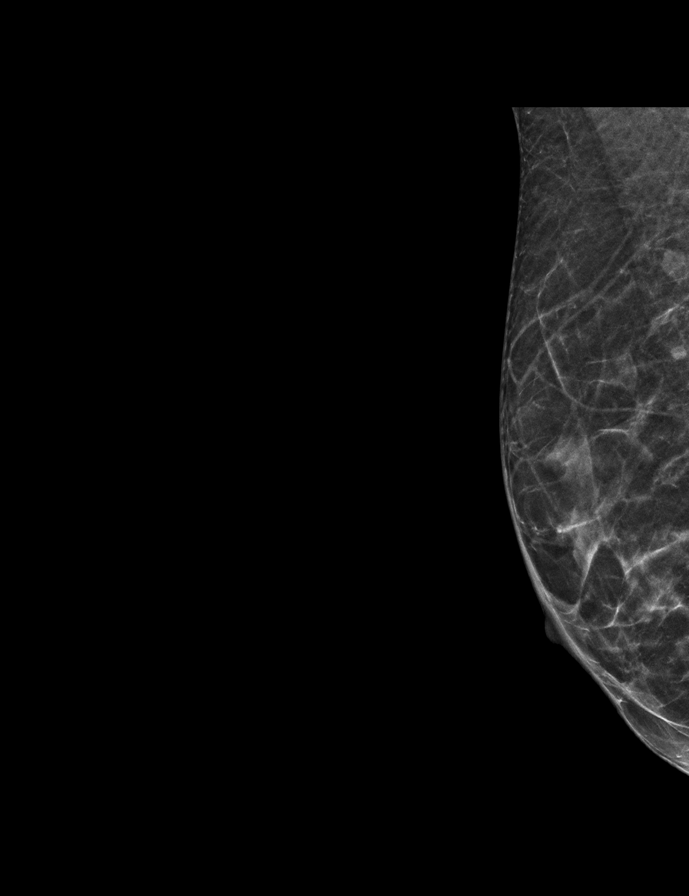

[R CC synth-2D]
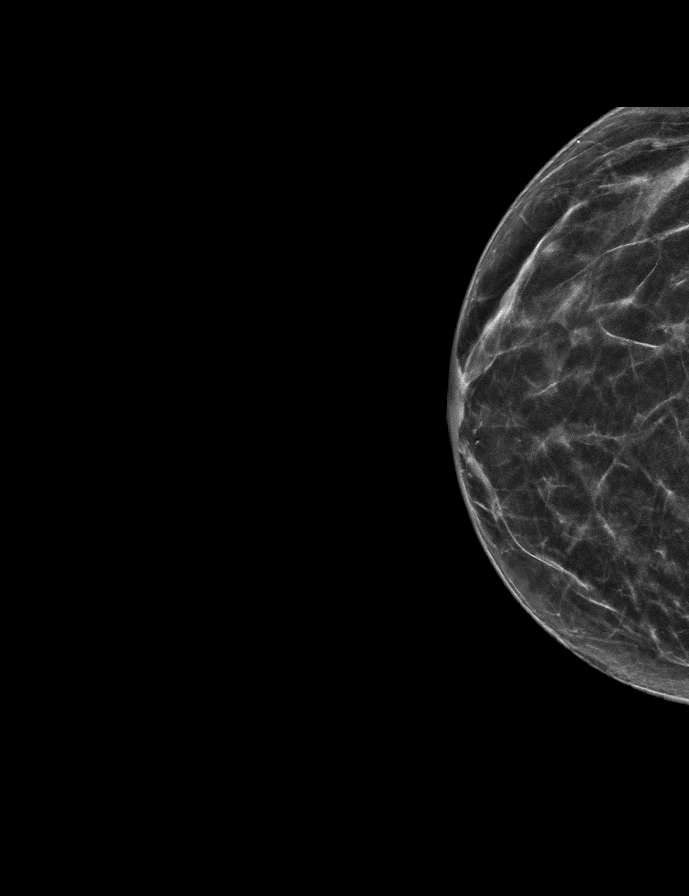

[L CC synth-2D]
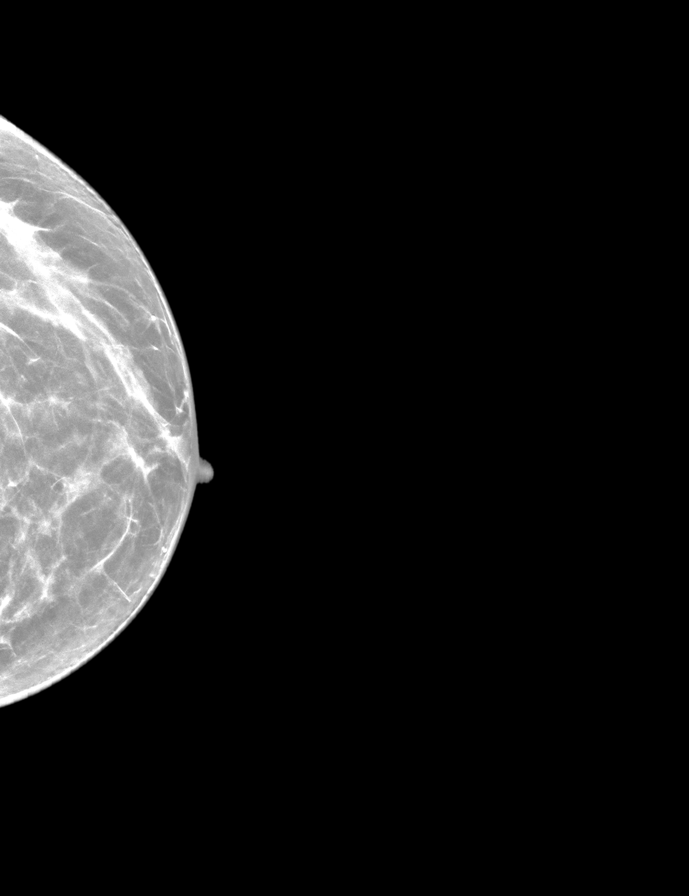

[9 of 29 positions shown; findings below may reference images not displayed]

ACR Breast Density Category c: The breast tissue is heterogeneously
dense, which may obscure small masses.
FINDINGS: The patient has retropectoral implants. There are no findings
suspicious for malignancy.
IMPRESSION: No mammographic evidence of malignancy. A result letter of this
screening mammogram will be mailed directly to the patient.

RECOMMENDATION:
Screening mammogram in one year. (Code:LT-E-7TH)

BI-RADS CATEGORY  1:  Negative.

## 2023-09-18 ENCOUNTER — Telehealth (HOSPITAL_BASED_OUTPATIENT_CLINIC_OR_DEPARTMENT_OTHER): Payer: Self-pay | Admitting: Obstetrics & Gynecology

## 2023-11-15 ENCOUNTER — Encounter (HOSPITAL_BASED_OUTPATIENT_CLINIC_OR_DEPARTMENT_OTHER): Payer: Self-pay | Admitting: Certified Nurse Midwife

## 2023-12-04 ENCOUNTER — Encounter (HOSPITAL_BASED_OUTPATIENT_CLINIC_OR_DEPARTMENT_OTHER): Payer: Self-pay | Admitting: Certified Nurse Midwife

## 2023-12-04 ENCOUNTER — Ambulatory Visit (INDEPENDENT_AMBULATORY_CARE_PROVIDER_SITE_OTHER): Admitting: Certified Nurse Midwife

## 2023-12-04 VITALS — BP 90/52 | HR 66 | Ht 66.0 in | Wt 148.0 lb

## 2023-12-04 DIAGNOSIS — Z758 Other problems related to medical facilities and other health care: Secondary | ICD-10-CM

## 2023-12-04 DIAGNOSIS — N951 Menopausal and female climacteric states: Secondary | ICD-10-CM

## 2023-12-04 MED ORDER — VENLAFAXINE HCL ER 75 MG PO CP24: 75.0000 mg | ORAL_CAPSULE | Freq: Every day | ORAL | 3 refills | Status: AC

## 2023-12-04 MED ORDER — LISDEXAMFETAMINE DIMESYLATE 30 MG PO CAPS: 30.0000 mg | ORAL_CAPSULE | Freq: Every day | ORAL | 0 refills | Status: DC

## 2023-12-04 NOTE — Progress Notes (Unsigned)
 Subjective:     Lindsay Burns is a 53 y.o. female here for follow-up. Pt requests refill of Effexor  and Vyvanse . She may be in process of transferring Primary Care (location). Pt here for follow-up on Progesterone . States she took Progesterone  for 90 days. During that time she experienced menstrual periods x 2. She ran out of Progesterone  a few days ago. Her last menstrual period was end of August. She denies hot flashes. Has occasional night sweats. There has been no vaginal spotting or bleeding in September (thus far).   The following portions of the patient's history were reviewed and updated as appropriate: allergies, current medications, past family history, past medical history, past social history, past surgical history, and problem list.   Review of Systems Pertinent items are noted in HPI.    Objective:    {exam; complete:18323}    Assessment:    {vaginitis dx:15349}.    Plan:    {vaginitis treatments:14231}

## 2024-02-14 ENCOUNTER — Encounter (HOSPITAL_BASED_OUTPATIENT_CLINIC_OR_DEPARTMENT_OTHER): Payer: Self-pay | Admitting: Family Medicine

## 2024-02-14 ENCOUNTER — Ambulatory Visit (HOSPITAL_BASED_OUTPATIENT_CLINIC_OR_DEPARTMENT_OTHER): Admitting: Family Medicine

## 2024-02-14 VITALS — BP 105/71 | HR 78 | Ht 66.5 in | Wt 148.8 lb

## 2024-02-14 DIAGNOSIS — F902 Attention-deficit hyperactivity disorder, combined type: Secondary | ICD-10-CM

## 2024-02-14 DIAGNOSIS — Z7689 Persons encountering health services in other specified circumstances: Secondary | ICD-10-CM

## 2024-02-14 DIAGNOSIS — Z811 Family history of alcohol abuse and dependence: Secondary | ICD-10-CM | POA: Insufficient documentation

## 2024-02-14 DIAGNOSIS — E559 Vitamin D deficiency, unspecified: Secondary | ICD-10-CM

## 2024-02-14 LAB — POCT URINE DRUG SCREEN
Morphine: NOT DETECTED
POC BENZODIAZEPINES UR: NOT DETECTED
POC Cocaine UR: NOT DETECTED
POC Marijuana UR: NOT DETECTED
POC Oxycodone UR: NOT DETECTED

## 2024-02-14 MED ORDER — LISDEXAMFETAMINE DIMESYLATE 40 MG PO CAPS: 40.0000 mg | ORAL_CAPSULE | Freq: Every day | ORAL | 0 refills | Status: AC

## 2024-02-14 NOTE — Progress Notes (Signed)
 Subjective:   Lindsay Burns 17-Aug-1970 02/14/2024  Chief Complaint  Patient presents with   New Patient (Initial Visit)    Pt is here today to get established with the practice. Only concern is her ADD.    Discussed the use of AI scribe software for clinical note transcription with the patient, who gave verbal consent to proceed.  History of Present Illness Lindsay Burns is a 53 year old female with ADD who presents to establish care and for a medication review and management of ADD. Her previous PCP was Adult Nurse at [facility] with her last AE in January 2025.  She has a history of ADD and is currently taking Vyvanse  30 mg daily. She has previously been on higher doses and sometimes discontinues the medication to 'reset' her body, but this leads to decreased productivity and dissatisfaction from her clients. 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  She has a history of vitamin D  deficiency, which has been difficult to manage. Despite taking prescription vitamin D  for an extended period, her levels only recently reached the low end of normal. She currently takes over-the-counter vitamin D  gummies, providing 750% of the daily recommended allowance, which maintains her levels at the lowest end of normal.  There is a family history of addiction, with both parents having met in Alcoholics Anonymous. She is aware of her predisposition and is vigilant about potential substance use issues.    The following portions of the patient's history were reviewed and updated as appropriate: past medical history, past surgical history, family history, social history, allergies, medications, and problem list.   Patient Active Problem List   Diagnosis Date Noted   Family history of alcohol abuse 02/14/2024   Irregular menses 06/04/2023   Anxiety 08/28/2022   Adhesive  capsulitis of left shoulder 01/03/2020   Vitamin D  deficiency 01/30/2019   ADD (attention deficit disorder) 11/25/2014   Family history of breast cancer 11/25/2014   Sleep disorder 11/25/2014   Past Medical History:  Diagnosis Date   ADD (attention deficit disorder)    dx: 2006   Allergy    seasonal   B12 deficiency    Basal cell carcinoma    left side of face   Sleep disorder    Vitamin D  deficiency    Past Surgical History:  Procedure Laterality Date   BASAL CELL CARCINOMA EXCISION Left    left face   BREAST ENHANCEMENT SURGERY Bilateral    1999   DILATION AND CURETTAGE OF UTERUS  2004   TEMPOROMANDIBULAR JOINT SURGERY Bilateral 1987   Family History  Problem Relation Age of Onset   Alcohol abuse Mother        was in AA   Breast cancer Mother 61   Depression Mother    Esophageal cancer Mother    Alcohol abuse Father        was in AA   Lung cancer Father    Brain cancer Father    Breast cancer Paternal Grandmother    Colon cancer Paternal Uncle    Colitis Maternal Grandmother    Colon polyps Neg Hx    Rectal cancer Neg Hx    Stomach cancer Neg Hx    Outpatient Medications Prior to Visit  Medication Sig Dispense Refill   Cholecalciferol  (VITAMIN D ) 50 MCG (2000 UT) tablet Take 2,000 Units by mouth daily.  Multiple Vitamin (MULTIVITAMIN PO) Take by mouth.     venlafaxine  XR (EFFEXOR  XR) 75 MG 24 hr capsule Take 1 capsule (75 mg total) by mouth daily with breakfast. 90 capsule 3   Cholecalciferol  100 MCG (4000 UT) CAPS Take 1 capsule (4,000 Units total) by mouth daily. 90 capsule 3   lisdexamfetamine (VYVANSE ) 30 MG capsule Take 1 capsule (30 mg total) by mouth daily. 90 capsule 0   progesterone  (PROMETRIUM ) 100 MG capsule Take 1 capsule (100 mg total) by mouth daily. 90 capsule 0   loratadine (CLARITIN) 10 MG tablet Take 10 mg by mouth daily.     No facility-administered medications prior to visit.   No Known Allergies   ROS: A complete ROS was performed  with pertinent positives/negatives noted in the HPI. The remainder of the ROS are negative.    Objective:   Today's Vitals   02/14/24 0839  BP: 105/71  Pulse: 78  SpO2: 96%  Weight: 148 lb 12.8 oz (67.5 kg)  Height: 5' 6.5 (1.689 m)    Physical Exam   GENERAL: Well-appearing, in NAD. Well nourished.  SKIN: Pink, warm and dry.  Head: Normocephalic. NECK: Trachea midline. Full ROM w/o pain or tenderness.  RESPIRATORY: Chest wall symmetrical. Respirations even and non-labored.  MSK: Muscle tone and strength appropriate for age.  NEUROLOGIC: No motor or sensory deficits. Steady, even gait. C2-C12 intact.  PSYCH/MENTAL STATUS: Alert, oriented x 3. Cooperative, appropriate mood and affect. Mildly rapid speech   Results for orders placed or performed in visit on 02/14/24  POCT Urine Drug Screen   Collection Time: 02/14/24  9:30 AM  Result Value Ref Range   POC Methamphetamine UR     POC Opiate Ur     POC Barbiturate UR     POC Amphetamine  UR     POC Oxycodone UR None Detected None Detected   POC Cocaine UR None Detected None Detected   POC Ecstasy UR     POC TRICYCLICS UR     POC PHENCYCLIDINE UR     POC Marijuana UR None Detected None Detected   POC Methadone UR     POC BENZODIAZEPINES UR None Detected None Detected   URINE TEMPERATURE     POC DRUG SCREEN OXIDANTS URINE     POC SPECIFIC GRAVITY URINE     POC PH URINE     Methylenedioxyamphetamine       Morphine none detected        Assessment & Plan:  1. Encounter to establish care with new doctor (Primary) Reviewed patient's medical, surgical and family history.   2. Vitamin D  deficiency Currently taking Vitamin D  2000 units daily. Will recheck lab with upcoming AE.   3. Attention deficit hyperactivity disorder (ADHD), combined type Uncontrolled currently. UDS and CSA updated today. Will increase dosage to Vyvanse  40mg  daily and return in 3 months or sooner as needed if no improvement. Safe use reviewed and PDMP  reviewed.  - lisdexamfetamine (VYVANSE ) 40 MG capsule; Take 1 capsule (40 mg total) by mouth daily.  Dispense: 90 capsule; Refill: 0 - POCT Urine Drug Screen   Meds ordered this encounter  Medications   lisdexamfetamine (VYVANSE ) 40 MG capsule    Sig: Take 1 capsule (40 mg total) by mouth daily.    Dispense:  90 capsule    Refill:  0    Supervising Provider:   DE CUBA, RAYMOND J [[id number]]   Lab Orders         POCT  Urine Drug Screen      Return in about 3 months (around 05/14/2024) for ANNUAL PHYSICAL (fasting labs prior) .    Patient to reach out to office if new, worrisome, or unresolved symptoms arise or if no improvement in patient's condition. Patient verbalized understanding and is agreeable to treatment plan. All questions answered to patient's satisfaction.    Thersia Schuyler Stark, [place]

## 2024-02-17 ENCOUNTER — Ambulatory Visit (INDEPENDENT_AMBULATORY_CARE_PROVIDER_SITE_OTHER): Admitting: Certified Nurse Midwife

## 2024-02-17 ENCOUNTER — Other Ambulatory Visit (HOSPITAL_BASED_OUTPATIENT_CLINIC_OR_DEPARTMENT_OTHER): Payer: Self-pay

## 2024-02-17 ENCOUNTER — Encounter (HOSPITAL_BASED_OUTPATIENT_CLINIC_OR_DEPARTMENT_OTHER): Payer: Self-pay | Admitting: Certified Nurse Midwife

## 2024-02-17 VITALS — BP 105/61 | HR 84 | Ht 66.0 in | Wt 146.8 lb

## 2024-02-17 DIAGNOSIS — N951 Menopausal and female climacteric states: Secondary | ICD-10-CM

## 2024-02-17 MED ORDER — ESTRADIOL-NORETHINDRONE ACET 0.05-0.14 MG/DAY TD PTTW: 1.0000 | MEDICATED_PATCH | TRANSDERMAL | 3 refills | Status: AC

## 2024-02-17 MED ORDER — ESTRADIOL-NORETHINDRONE ACET 0.05-0.14 MG/DAY TD PTTW: 1.0000 | MEDICATED_PATCH | TRANSDERMAL | 12 refills | Status: DC

## 2024-02-17 NOTE — Progress Notes (Signed)
 Subjective:     Lindsay Burns is a 53 y.o. female who presents for problem gyn visit. Pt reports night sweats, fatigue, +vasomotor symptoms (menopause). No vaginal spotting or bleeding. No changes in libido noted. LMP was around end of August (no period since that time). Pt was on Progesterone  at the time of that period, but she has discontinued use of progesterone .    The following portions of the patient's history were reviewed and updated as appropriate: allergies, current medications, past family history, past medical history, past social history, past surgical history, and problem list.   Review of Systems Pertinent items are noted in HPI.    Objective:    BP 105/61   Pulse 84   Ht 5' 6 (1.676 m) Comment: Reported  Wt 146 lb 12.8 oz (66.6 kg)   BMI 23.69 kg/m  General appearance: alert, cooperative, and appears stated age Abdomen: soft, non-tender; bowel sounds normal; no masses,  no organomegaly    Assessment:    Perimenopausal vasomotor symptoms.    Plan:    Discussed options for management of perimenopausal vasomotor symptoms. No contraindicates noted for HT. Pt desired to try CombiPatch twice weekly. Discussed risks/benefits/dosing/symptoms to report.  RTO in March for annual gyn exam and prn if issues arise. Arland MARLA Roller

## 2024-05-07 ENCOUNTER — Ambulatory Visit (HOSPITAL_BASED_OUTPATIENT_CLINIC_OR_DEPARTMENT_OTHER)

## 2024-05-14 ENCOUNTER — Encounter (HOSPITAL_BASED_OUTPATIENT_CLINIC_OR_DEPARTMENT_OTHER): Admitting: Family Medicine
